# Patient Record
Sex: Female | Born: 1960 | Race: White | Hispanic: No | Marital: Single | Smoking: Never smoker
Health system: Southern US, Community
[De-identification: ages and names within clinical notes are randomized; demographics above are authoritative.]

## PROBLEM LIST (undated history)

## (undated) DIAGNOSIS — Z8619 Personal history of other infectious and parasitic diseases: Secondary | ICD-10-CM

## (undated) DIAGNOSIS — N959 Unspecified menopausal and perimenopausal disorder: Secondary | ICD-10-CM

## (undated) DIAGNOSIS — Z87440 Personal history of urinary (tract) infections: Secondary | ICD-10-CM

## (undated) DIAGNOSIS — N200 Calculus of kidney: Secondary | ICD-10-CM

## (undated) DIAGNOSIS — F411 Generalized anxiety disorder: Secondary | ICD-10-CM

## (undated) HISTORY — DX: Unspecified menopausal and perimenopausal disorder: N95.9

## (undated) HISTORY — PX: AUGMENTATION MAMMAPLASTY: SUR837

## (undated) HISTORY — DX: Personal history of urinary (tract) infections: Z87.440

## (undated) HISTORY — PX: COLONOSCOPY: SHX174

## (undated) HISTORY — DX: Personal history of other infectious and parasitic diseases: Z86.19

## (undated) HISTORY — DX: Generalized anxiety disorder: F41.1

## (undated) HISTORY — DX: Calculus of kidney: N20.0

---

## 1994-01-27 HISTORY — PX: BREAST ENHANCEMENT SURGERY: SHX7

## 1997-03-20 ENCOUNTER — Other Ambulatory Visit: Admission: RE | Admit: 1997-03-20 | Discharge: 1997-03-20 | Payer: Self-pay | Admitting: Obstetrics and Gynecology

## 1997-10-10 ENCOUNTER — Other Ambulatory Visit: Admission: RE | Admit: 1997-10-10 | Discharge: 1997-10-10 | Payer: Self-pay | Admitting: Obstetrics and Gynecology

## 1997-11-17 ENCOUNTER — Other Ambulatory Visit: Admission: RE | Admit: 1997-11-17 | Discharge: 1997-11-17 | Payer: Self-pay | Admitting: Obstetrics and Gynecology

## 1998-05-28 ENCOUNTER — Other Ambulatory Visit: Admission: RE | Admit: 1998-05-28 | Discharge: 1998-05-28 | Payer: Self-pay | Admitting: Obstetrics and Gynecology

## 1998-12-03 ENCOUNTER — Other Ambulatory Visit: Admission: RE | Admit: 1998-12-03 | Discharge: 1998-12-03 | Payer: Self-pay | Admitting: Obstetrics and Gynecology

## 1999-07-25 ENCOUNTER — Other Ambulatory Visit: Admission: RE | Admit: 1999-07-25 | Discharge: 1999-07-25 | Payer: Self-pay | Admitting: Obstetrics and Gynecology

## 2001-01-21 ENCOUNTER — Other Ambulatory Visit: Admission: RE | Admit: 2001-01-21 | Discharge: 2001-01-21 | Payer: Self-pay | Admitting: *Deleted

## 2012-02-06 ENCOUNTER — Encounter: Payer: Self-pay | Admitting: Family Medicine

## 2012-02-06 ENCOUNTER — Ambulatory Visit (INDEPENDENT_AMBULATORY_CARE_PROVIDER_SITE_OTHER): Payer: Self-pay | Admitting: Family Medicine

## 2012-02-06 VITALS — BP 128/86 | HR 94 | Temp 98.3°F | Ht 65.0 in | Wt 158.0 lb

## 2012-02-06 DIAGNOSIS — J019 Acute sinusitis, unspecified: Secondary | ICD-10-CM | POA: Insufficient documentation

## 2012-02-06 MED ORDER — CEFUROXIME AXETIL 500 MG PO TABS: 500.0000 mg | ORAL_TABLET | Freq: Two times a day (BID) | ORAL | Status: DC

## 2012-02-06 NOTE — Assessment & Plan Note (Signed)
Ceftin 500mg  bid x 10d. Mucinex DM OTC. Saline nasal spray 2-3 times per day. Tylenol or motrin prn pain.

## 2012-02-06 NOTE — Patient Instructions (Signed)
Mucinex DM OTC as directed on the box. Saline nasal spray 2-3 times per day. Lots of fluids and REST.

## 2012-02-06 NOTE — Progress Notes (Signed)
Office Note 02/06/2012  CC:  Chief Complaint  Patient presents with  . Establish Care    cough, congestion, runny nose, sneezing x 1 week, symptoms not going away    HPI:  Cynthia Booth is a 52 y.o. White female who is here to establish care and discuss cold sx's. Patient's most recent primary MD: none (some urgent cares in [place], years ago saw [facility] [employer]). Old records were not reviewed prior to or during today's visit.  Pt presents complaining of respiratory symptoms for 7 days.  Primary symptoms are: nasal congestion and drainage, lots of coughing and gets much worse.  Worst symptoms seems to be the cough.  Lately the symptoms seem to be worsening.  She describes double-sickening.  Unsure if she's had fever b/c she is having hot flashes due to menopause lately. Pertinent negatives: No fevers, no wheezing, and no SOB.  No pain in face or teeth.  No significant HA.  ST was initially bad but now it is gone. Symptoms made worse by night.  Symptoms improved by nyquil/ helps for short periods. Smoker? no Recent sick contact? no Muscle or joint aches? No, but feels fatigued on and off. Flu shot this season at least 2 wks ago? no  Additional ROS: no n/v/d or abdominal pain.  No rash.  No neck stiffness.   +Mild fatigue.  +Mild appetite loss. LMP about 8 mo ago--having hot flashes.  Of note, she has never had a mammogram (her choice) but she has gotten regular pap/pelvics (last was 2 yrs ago) and has no hx of abnormal paps.   Past Medical History  Diagnosis Date  . Nephrolithiasis   . History of infectious mononucleosis     In High School.  She had hepatitis from the mono (NOT HEP B OR HEP C virus).  . History of recurrent UTIs     when she has stones  . Perimenopausal disorder     Past Surgical History  Procedure Date  . Breast enhancement surgery 1996    Family History  Problem Relation Age of Onset  . Cancer Brother     NHL  . Hypertension  Maternal Grandmother     History   Social History  . Marital Status: Single    Spouse Name: N/A    Number of Children: N/A  . Years of Education: N/A   Occupational History  . Not on file.   Social History Main Topics  . Smoking status: Never Smoker   . Smokeless tobacco: Never Used  . Alcohol Use: No  . Drug Use: No  . Sexually Active: Not on file   Other Topics Concern  . 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    Outpatient Encounter Prescriptions as of 02/06/2012  Medication Sig Dispense Refill  . cefUROXime (CEFTIN) 500 MG tablet Take 1 tablet (500 mg total) by mouth 2 (two) times daily.  20 tablet  0    Allergies  Allergen Reactions  . Penicillins Hives    No tongue, lips, or throat swelling.  No SOB or wheezing.    ROS Review of Systems  Constitutional: Positive for fatigue. Negative for fever.  HENT:       See HPI  Eyes: Negative for visual disturbance.  Respiratory: Positive for cough.  See HPI  Cardiovascular: Negative for chest pain.  Gastrointestinal: Negative for nausea and abdominal pain.  Genitourinary: Negative for dysuria.  Musculoskeletal: Negative for back pain and joint swelling.  Skin: Negative for rash.  Neurological: Negative for weakness and headaches.  Hematological: Negative for adenopathy.    PE; Blood pressure 128/86, pulse 94, temperature 98.3 F (36.8 C), temperature source Temporal, height 5\' 5"  (1.651 m), weight 158 lb (71.668 kg), SpO2 94.00%. VS: noted--normal. Gen: alert, NAD, WELL- APPEARING WF in NAD. HEENT: eyes without injection, drainage, or swelling.  Ears: EACs clear, TMs with normal light reflex and landmarks.  Nose: Clear rhinorrhea, with some dried, crusty exudate adherent to mildly injected  mucosa.  No purulent d/c.  No paranasal sinus TTP.  No facial swelling.  Throat and mouth without focal lesion.  No pharyngial swelling, erythema, or exudate.   Neck: supple, no LAD.   LUNGS: CTA bilat, nonlabored resps.   CV: RRR, no m/r/g. EXT: no c/c/e SKIN: no rash   Pertinent labs:  none  ASSESSMENT AND PLAN:   New pt: no old records to obtain.  Sinusitis, acute Ceftin 500mg  bid x 10d. Mucinex DM OTC. Saline nasal spray 2-3 times per day. Tylenol or motrin prn pain.   An After Visit Summary was printed and given to the patient.  Return if symptoms worsen or fail to improve.

## 2012-02-16 ENCOUNTER — Telehealth: Payer: Self-pay | Admitting: Family Medicine

## 2012-02-16 MED ORDER — AZITHROMYCIN 250 MG PO TABS: ORAL_TABLET | ORAL | Status: DC

## 2012-02-16 NOTE — Telephone Encounter (Signed)
Pt notified.  She will continue Mucinex and nasal saline.

## 2012-02-16 NOTE — Telephone Encounter (Signed)
Dr McGowen, please advise. 

## 2012-02-16 NOTE — Telephone Encounter (Signed)
Pt was seen in the office on 02/06/12 for sinusitis.  Pt was treated with Ceftin.  Caller states the medication lightened her symptoms but it never went away.  She states she is still having the same symptoms of coughing all night and congestion as she did when seen.  Caller is requesting another antibiotic to be prescribed for her.  OFFICE PLEASE FOLLOW UP WITH PATIENT REGARDING RX.  Pt uses [facility] off [address].  Pt denies any difficulty breathing or wheezing.

## 2012-02-16 NOTE — Telephone Encounter (Signed)
Z-pack eRx'd just now. Make sure she is still doing the mucinex DM and saline nasal irrigation like I told her last visit. If not improved after this 5 day course of antibiotic then needs office f/u.-thx

## 2012-10-28 ENCOUNTER — Ambulatory Visit (INDEPENDENT_AMBULATORY_CARE_PROVIDER_SITE_OTHER): Payer: BC Managed Care – PPO | Admitting: Family Medicine

## 2012-10-28 ENCOUNTER — Encounter: Payer: Self-pay | Admitting: Family Medicine

## 2012-10-28 VITALS — BP 117/78 | HR 86 | Temp 97.8°F | Resp 16 | Ht 65.0 in | Wt 161.0 lb

## 2012-10-28 DIAGNOSIS — N390 Urinary tract infection, site not specified: Secondary | ICD-10-CM

## 2012-10-28 DIAGNOSIS — K219 Gastro-esophageal reflux disease without esophagitis: Secondary | ICD-10-CM

## 2012-10-28 LAB — POCT URINALYSIS DIPSTICK
Bilirubin, UA: NEGATIVE
Glucose, UA: NEGATIVE
Spec Grav, UA: >=1.03
pH, UA: 5.5

## 2012-10-28 MED ORDER — LANSOPRAZOLE 15 MG PO CPDR: 15.0000 mg | DELAYED_RELEASE_CAPSULE | Freq: Every day | ORAL | Status: DC

## 2012-10-28 MED ORDER — SULFAMETHOXAZOLE-TMP DS 800-160 MG PO TABS: 1.0000 | ORAL_TABLET | Freq: Two times a day (BID) | ORAL | Status: DC

## 2012-10-28 NOTE — Progress Notes (Signed)
OFFICE NOTE  10/28/2012  CC:  Chief Complaint  Patient presents with  . Dysuria    x 3 days  . Urinary Frequency  . Heartburn     HPI: Patient is a 52 y.o. Caucasian female who is here for dysuria.   Onset about 3 d/a, dysuria, urinary urgency, urinary frequency.  No fever or nausea or abd pain. Right mid back pain onset this morning--mild.  Also reports about 1 yr of heartburn daily.  Didn't respond to H2 blockers but responds fully to prevacid 15mg  OTC.  Pertinent PMH:  Past Medical History  Diagnosis Date  . Nephrolithiasis   . History of infectious mononucleosis     In High School.  She had hepatitis from the mono (NOT HEP B OR HEP C virus).  . History of recurrent UTIs     when she has stones  . Perimenopausal disorder    Past surgical, social, and family history reviewed and no changes noted since last office visit.  MEDS:  Prevacid 15mg  qd   PE: Blood pressure 117/78, pulse 86, temperature 97.8 F (36.6 C), temperature source Temporal, resp. rate 16, height 5\' 5"  (1.651 m), weight 161 lb (73.029 kg), SpO2 93.00%. Gen: Alert, well appearing.  Patient is oriented to person, place, time, and situation. CV: RRR, no m/r/g.   LUNGS: CTA bilat, nonlabored resps, good aeration in all lung fields. BACK: no CVA tenderness.  LAB: CC UA today--moderate blood, trace prot, moderate LEU, SG 1.030.  Otherwise UA normal.  IMPRESSION AND PLAN:  1) UTI, she appears well and has no signs of pyelo on exam. Bactrim DS 1 bid x 5d, send urine for c/s.  2) GERD: discussed dietary adjustments (GERD diet handout reviewed and given to patient), meds. Rx for generic prevacid 15mg  qd.  FOLLOW UP: prn

## 2012-10-30 LAB — URINE CULTURE: Colony Count: >=100000

## 2012-12-13 ENCOUNTER — Encounter: Payer: Self-pay | Admitting: Family Medicine

## 2012-12-13 ENCOUNTER — Ambulatory Visit (INDEPENDENT_AMBULATORY_CARE_PROVIDER_SITE_OTHER): Payer: BC Managed Care – PPO | Admitting: Family Medicine

## 2012-12-13 VITALS — BP 122/85 | HR 70 | Temp 97.6°F | Resp 16 | Ht 65.0 in | Wt 167.0 lb

## 2012-12-13 DIAGNOSIS — J387 Other diseases of larynx: Secondary | ICD-10-CM

## 2012-12-13 DIAGNOSIS — K219 Gastro-esophageal reflux disease without esophagitis: Secondary | ICD-10-CM

## 2012-12-13 DIAGNOSIS — L989 Disorder of the skin and subcutaneous tissue, unspecified: Secondary | ICD-10-CM

## 2012-12-13 MED ORDER — LANSOPRAZOLE 30 MG PO CPDR: 30.0000 mg | DELAYED_RELEASE_CAPSULE | Freq: Every day | ORAL | Status: DC

## 2012-12-13 NOTE — Progress Notes (Signed)
OFFICE NOTE   12/13/2012  CC:  Chief Complaint  Patient presents with  . glands swollen    white spots in throat x 3 days     HPI: Patient is a 52 y.o. Caucasian female who is here for feeling spots on back of throat starting 3 d/a, says they appear white.  Today woke up with sore on inside of mouth on right, feels some swollen neck glands with this sore.  No fevers, no malaise.  Appetite is OK, energy level is fine. Cough occ b/c the throat feels irritated.  Also has a spot on her face on the right cheek that has increased in size (doubled) over the last 4-5 mo.  The spot gets a little "crusty" occasionally.  No pain or itching.  No change in pigment.  Pertinent PMH:  Past Medical History  Diagnosis Date  . Nephrolithiasis   . History of infectious mononucleosis     In High School.  She had hepatitis from the mono (NOT HEP B OR HEP C virus).  . History of recurrent UTIs     when she has stones  . Perimenopausal disorder   GERD  Past surgical, social, and family history reviewed and no changes noted since last office visit.  MEDS:  Prevacid 15mg  qd  PE: Blood pressure 122/85, pulse 70, temperature 97.6 F (36.4 C), temperature source Temporal, resp. rate 16, height 5\' 5"  (1.651 m), weight 167 lb (75.751 kg), SpO2 98.00%. Gen: Alert, well appearing.  Patient is oriented to person, place, time, and situation. ENT: Ears: EACs clear, normal epithelium.  TMs with good light reflex and landmarks bilaterally.  Eyes: no injection, icteris, swelling, or exudate.  EOMI, PERRLA. Nose: no drainage or turbinate edema/swelling.  No injection or focal lesion.  Mouth: lips without lesion/swelling.  Oral mucosa pink and moist.  Dentition intact and without obvious caries or gingival swelling.  Oropharynx without erythema, exudate, or swelling.  She has a scant amount of tonsillar tissue, but there is a tiny amount of white material collected in a crypt on each tonsil.   I cannot see any  sores/ulcers anywhere.   IMPRESSION AND PLAN:  Laryngopharyngeal reflux Increase prevacid to 30mg  cap po qd. She'll pay closer attention to the dietary restrictions we discussed last visit. Elevated head of bed with a brick or 2 X 4.  Skin lesion of face Due to recent increase in size, we'll get her to derm for consideration of biopsy/removal.   An After Visit Summary was printed and given to the patient.  FOLLOW UP: prn

## 2012-12-13 NOTE — Assessment & Plan Note (Signed)
Increase prevacid to 30mg  cap po qd. She'll pay closer attention to the dietary restrictions we discussed last visit. Elevated head of bed with a brick or 2 X 4.

## 2012-12-13 NOTE — Assessment & Plan Note (Signed)
Due to recent increase in size, we'll get her to derm for consideration of biopsy/removal.

## 2012-12-22 ENCOUNTER — Encounter: Payer: Self-pay | Admitting: Family Medicine

## 2013-02-08 ENCOUNTER — Encounter: Payer: Self-pay | Admitting: Family Medicine

## 2013-02-08 ENCOUNTER — Ambulatory Visit (INDEPENDENT_AMBULATORY_CARE_PROVIDER_SITE_OTHER): Payer: Self-pay | Admitting: Family Medicine

## 2013-02-08 VITALS — BP 95/56 | HR 90 | Temp 98.2°F | Ht 65.0 in | Wt 167.0 lb

## 2013-02-08 DIAGNOSIS — J209 Acute bronchitis, unspecified: Secondary | ICD-10-CM

## 2013-02-08 MED ORDER — PREDNISONE 20 MG PO TABS: ORAL_TABLET | ORAL | Status: DC

## 2013-02-08 NOTE — Progress Notes (Signed)
Pre-visit discussion using our clinic review tool. No additional management support is needed unless otherwise documented below in the visit note.  

## 2013-02-08 NOTE — Progress Notes (Signed)
OFFICE NOTE  02/08/2013  CC:  Chief Complaint  Patient presents with  . Cough  . Sore Throat     HPI: Patient is a 53 y.o. Caucasian female who is here for cough/ST. Onset of bad ST 2 and 1/2 wks ago, severe fatigue, subjective fever, then started having lots and lots of coughing and nasal/sinus congestion.  She last felt like she had fever 3-4 days ago.  "Still stuffy in head and chest".  Upper chest hurts and upper back hurts when coughing, feels tight, slight SOB feeling.  No wheezing.  Tried mucinex DM and aspirin--helped a little. She did not get flu vaccine this season.  Pertinent PMH:  Past Medical History  Diagnosis Date  . Nephrolithiasis   . History of infectious mononucleosis     In High School.  She had hepatitis from the mono (NOT HEP B OR HEP C virus).  . History of recurrent UTIs     when she has stones  . Perimenopausal disorder     MEDS: as per HPI Outpatient Prescriptions Prior to Visit  Medication Sig Dispense Refill  . lansoprazole (PREVACID) 30 MG capsule Take 1 capsule (30 mg total) by mouth daily at 12 noon.  30 capsule  6   No facility-administered medications prior to visit.    PE: Blood pressure 95/56, pulse 90, temperature 98.2 F (36.8 C), temperature source Oral, height 5\' 5"  (1.651 m), weight 167 lb (75.751 kg), SpO2 94.00%. VS: noted--normal. Gen: alert, NAD, NONTOXIC APPEARING. HEENT: eyes without injection, drainage, or swelling.  Ears: EACs clear, TMs with normal light reflex and landmarks.  Nose: Clear rhinorrhea, with some dried, crusty exudate adherent to mildly injected mucosa.  No purulent d/c.  No paranasal sinus TTP.  No facial swelling.  Throat and mouth without focal lesion.  No pharyngial swelling, erythema, or exudate.   Neck: supple, no LAD.   LUNGS: CTA bilat, nonlabored resps.  Mild prolongation of exp phase but no wheezing. CV: RRR, no m/r/g. EXT: no c/c/e SKIN: no rash  IMPRESSION AND PLAN:  Acute bronchitis, suspect  viral etiology. Prednisone 40mg  qd x 5d. Symbicort sample 160/4.5 given today (pt has no insurance and cannot afford albut inhaler), 2 p bid x 2 wks.  FOLLOW UP: prn

## 2013-05-11 ENCOUNTER — Ambulatory Visit (INDEPENDENT_AMBULATORY_CARE_PROVIDER_SITE_OTHER): Payer: BC Managed Care – PPO | Admitting: Family Medicine

## 2013-05-11 ENCOUNTER — Encounter: Payer: Self-pay | Admitting: Family Medicine

## 2013-05-11 VITALS — BP 117/82 | HR 75 | Temp 98.2°F | Resp 18 | Ht 65.0 in | Wt 163.0 lb

## 2013-05-11 DIAGNOSIS — R300 Dysuria: Secondary | ICD-10-CM

## 2013-05-11 LAB — POCT URINALYSIS DIPSTICK
BILIRUBIN UA: NEGATIVE
Glucose, UA: 100
KETONES UA: NEGATIVE
Nitrite, UA: POSITIVE
PH UA: 5
PROTEIN UA: 100
Urobilinogen, UA: 2

## 2013-05-11 MED ORDER — SULFAMETHOXAZOLE-TMP DS 800-160 MG PO TABS: 1.0000 | ORAL_TABLET | Freq: Two times a day (BID) | ORAL | Status: DC

## 2013-05-11 NOTE — Progress Notes (Signed)
Pre visit review using our clinic review tool, if applicable. No additional management support is needed unless otherwise documented below in the visit note. 

## 2013-05-11 NOTE — Progress Notes (Signed)
OFFICE NOTE  05/11/2013  CC:  Chief Complaint  Patient presents with  . Dysuria    x 5 days  . Abdominal Pain     HPI: Patient is a 53 y.o. Caucasian female who is here for painful urination. Burning with urination onset 5d/a, some left suprapubic pain/LLQ pain.  No radiating flank pain or gross blood in urine. No nausea, no fever.  Took OTc AZO as recently as this morning. BM's: 1 loose stool per day since this started.  Pertinent PMH:  Past medical, surgical, social, and family history reviewed and no changes are noted since last office visit.  MEDS:  Prevacid 30mg  qd, AZO standard OTC lately  PE: Blood pressure 117/82, pulse 75, temperature 98.2 F (36.8 C), temperature source Temporal, resp. rate 18, height 5\' 5"  (1.651 m), weight 163 lb (73.936 kg), SpO2 98.00%. Gen: Alert, well appearing.  Patient is oriented to person, place, time, and situation. Oral: mucosa moist and pink CV: RRR, no m/r/g LUNGS: Clear CVA regions w/out TTP. ABD: soft, ND.  Some mild LLQ TTP w/out rebound or guarding.  No side or flank tenderness.  LAB: CC UA--orange/cloudy---globally positive/Abnl but not valid due to presence of AZO  IMPRESSION AND PLAN:  Lower UTI. Bactrim DS 1 bid x 3-5d. Urine sent for c/s.  An After Visit Summary was printed and given to the patient.   FOLLOW UP: prn

## 2013-05-14 LAB — URINE CULTURE: Colony Count: 70000

## 2013-05-18 ENCOUNTER — Encounter: Payer: Self-pay | Admitting: Family Medicine

## 2013-09-06 ENCOUNTER — Encounter: Payer: Self-pay | Admitting: Nurse Practitioner

## 2013-09-06 ENCOUNTER — Ambulatory Visit (INDEPENDENT_AMBULATORY_CARE_PROVIDER_SITE_OTHER): Payer: BC Managed Care – PPO | Admitting: Nurse Practitioner

## 2013-09-06 VITALS — BP 102/80 | HR 85 | Temp 98.0°F | Ht 65.0 in | Wt 162.0 lb

## 2013-09-06 DIAGNOSIS — K219 Gastro-esophageal reflux disease without esophagitis: Secondary | ICD-10-CM

## 2013-09-06 DIAGNOSIS — J0100 Acute maxillary sinusitis, unspecified: Secondary | ICD-10-CM

## 2013-09-06 MED ORDER — LANSOPRAZOLE 15 MG PO CPDR: 15.0000 mg | DELAYED_RELEASE_CAPSULE | Freq: Every day | ORAL | Status: DC

## 2013-09-06 MED ORDER — DOXYCYCLINE HYCLATE 100 MG PO TABS: 100.0000 mg | ORAL_TABLET | Freq: Two times a day (BID) | ORAL | Status: DC

## 2013-09-06 NOTE — Progress Notes (Signed)
Pre visit review using our clinic review tool, if applicable. No additional management support is needed unless otherwise documented below in the visit note. 

## 2013-09-06 NOTE — Progress Notes (Signed)
   Subjective:    Patient ID: Cynthia Booth, female    DOB: 10/21/1960, 53 y.o.   MRN: [id number]  HPI Comments: Would like to decrease omeprazole dose back to 15 mg-feels it controls symptoms. She has been taking it for about 3 mos. Higher dose made her have "full sensation" in throat. This resolved when she went back to lower dose.  Sinusitis This is a new problem. The current episode started in the past 7 days (6 days). The problem has been gradually worsening since onset. Maximum temperature: had chills. The pain is moderate. Associated symptoms include chills (resolved), congestion, coughing, headaches, sinus pressure, sneezing (resolved) and a sore throat (resolved). Pertinent negatives include no ear pain, hoarse voice or shortness of breath. Treatments tried: nyquil. The treatment provided no relief.      Review of Systems  Constitutional: Positive for chills (resolved).  HENT: Positive for congestion, sinus pressure, sneezing (resolved) and sore throat (resolved). Negative for ear pain and hoarse voice.   Respiratory: Positive for cough. Negative for shortness of breath.   Neurological: Positive for headaches.       Objective:   Physical Exam  Vitals reviewed. Constitutional: She is oriented to person, place, and time. She appears well-developed and well-nourished.  HENT:  Head: Normocephalic and atraumatic.  Right Ear: External ear normal.  Left Ear: External ear normal.  Mouth/Throat: Oropharynx is clear and moist. No oropharyngeal exudate.  Effusion L TM, bones visible, no retraction or bulge  Eyes: Conjunctivae are normal. Pupils are equal, round, and reactive to light. Right eye exhibits no discharge. Left eye exhibits no discharge.  Neck: Normal range of motion. Neck supple. No thyromegaly present.  Cardiovascular: Normal rate and normal heart sounds.   No murmur heard. Pulmonary/Chest: Effort normal and breath sounds normal. No respiratory distress. She has no  wheezes. She has no rales.  Lymphadenopathy:    She has no cervical adenopathy.  Neurological: She is alert and oriented to person, place, and time.  Skin: Skin is warm and dry.  Psychiatric: She has a normal mood and affect. Her behavior is normal. Thought content normal.          Assessment & Plan:  1. Acute maxillary sinusitis, recurrence not specified - doxycycline (VIBRA-TABS) 100 MG tablet; Take 1 tablet (100 mg total) by mouth 2 (two) times daily.  Dispense: 10 tablet; Refill: 0 F/u PRN  2. Gastroesophageal reflux disease without esophagitis - lansoprazole (PREVACID) 15 MG capsule; Take 1 capsule (15 mg total) by mouth daily at 12 noon.  Dispense: 30 capsule; Refill: 6

## 2013-09-06 NOTE — Patient Instructions (Signed)
Start daily sinus rinses (Neilmed Sinus rinse) for at least 5-7 days. Use pseudoephedrine 30 mg twice daily for 4-6 days. Take ibuprophen 200 to 400 mg twice daily for 4-6 days with food to decrease inflammation. If no improvement in 2 days, start doxycyline. Caution: no dairy within 2 hours, easy to sunburn. Eat yogurt daily at lunch or afternoon to help prevent diarrhea that can be caused by antibiotic.  Please call for re-evaluation if you are not improving.   Sinusitis Sinusitis is redness, soreness, and swelling (inflammation) of the paranasal sinuses. Paranasal sinuses are air pockets within the bones of your face (beneath the eyes, the middle of the forehead, or above the eyes). In healthy paranasal sinuses, mucus is able to drain out, and air is able to circulate through them by way of your nose. However, when your paranasal sinuses are inflamed, mucus and air can become trapped. This can allow bacteria and other germs to grow and cause infection. Sinusitis can develop quickly and last only a short time (acute) or continue over a long period (chronic). Sinusitis that lasts for more than 12 weeks is considered chronic.  CAUSES  Causes of sinusitis include:  Allergies.  Structural abnormalities, such as displacement of the cartilage that separates your nostrils (deviated septum), which can decrease the air flow through your nose and sinuses and affect sinus drainage.  Functional abnormalities, such as when the small hairs (cilia) that line your sinuses and help remove mucus do not work properly or are not present. SYMPTOMS  Symptoms of acute and chronic sinusitis are the same. The primary symptoms are pain and pressure around the affected sinuses. Other symptoms include:  Upper toothache.  Earache.  Headache.  Bad breath.  Decreased sense of smell and taste.  A cough, which worsens when you are lying flat.  Fatigue.  Fever.  Thick drainage from your nose, which often is green  and may contain pus (purulent).  Swelling and warmth over the affected sinuses. DIAGNOSIS  Your caregiver will perform a physical exam. During the exam, your caregiver may:  Look in your nose for signs of abnormal growths in your nostrils (nasal polyps).  Tap over the affected sinus to check for signs of infection.  View the inside of your sinuses (endoscopy) with a special imaging device with a light attached (endoscope), which is inserted into your sinuses. If your caregiver suspects that you have chronic sinusitis, one or more of the following tests may be recommended:  Allergy tests.  Nasal culture A sample of mucus is taken from your nose and sent to a lab and screened for bacteria.  Nasal cytology A sample of mucus is taken from your nose and examined by your caregiver to determine if your sinusitis is related to an allergy. TREATMENT  Most cases of acute sinusitis are related to a viral infection and will resolve on their own within 10 days. Sometimes medicines are prescribed to help relieve symptoms (pain medicine, decongestants, nasal steroid sprays, or saline sprays).  However, for sinusitis related to a bacterial infection, your caregiver will prescribe antibiotic medicines. These are medicines that will help kill the bacteria causing the infection.  Rarely, sinusitis is caused by a fungal infection. In theses cases, your caregiver will prescribe antifungal medicine. For some cases of chronic sinusitis, surgery is needed. Generally, these are cases in which sinusitis recurs more than 3 times per year, despite other treatments. HOME CARE INSTRUCTIONS   Drink plenty of water. Water helps thin the mucus  so your sinuses can drain more easily.  Use a humidifier.  Inhale steam 3 to 4 times a day (for example, sit in the bathroom with the shower running).  Apply a warm, moist washcloth to your face 3 to 4 times a day, or as directed by your caregiver.  Use saline nasal sprays to  help moisten and clean your sinuses.  Take over-the-counter or prescription medicines for pain, discomfort, or fever only as directed by your caregiver. SEEK IMMEDIATE MEDICAL CARE IF:  You have increasing pain or severe headaches.  You have nausea, vomiting, or drowsiness.  You have swelling around your face.  You have vision problems.  You have a stiff neck.  You have difficulty breathing. MAKE SURE YOU:   Understand these instructions.  Will watch your condition.  Will get help right away if you are not doing well or get worse. Document Released: 01/13/2005 Document Revised: 04/07/2011 Document Reviewed: 01/28/2011 [facility] Patient Information 2014 [place], [place].

## 2013-09-09 ENCOUNTER — Telehealth: Payer: Self-pay | Admitting: Nurse Practitioner

## 2013-09-09 ENCOUNTER — Encounter: Payer: Self-pay | Admitting: Family Medicine

## 2013-09-09 ENCOUNTER — Ambulatory Visit (INDEPENDENT_AMBULATORY_CARE_PROVIDER_SITE_OTHER): Payer: BC Managed Care – PPO | Admitting: Family Medicine

## 2013-09-09 VITALS — BP 113/74 | HR 76 | Temp 98.3°F | Resp 18 | Ht 65.0 in | Wt 162.0 lb

## 2013-09-09 DIAGNOSIS — J0100 Acute maxillary sinusitis, unspecified: Secondary | ICD-10-CM

## 2013-09-09 MED ORDER — CEFUROXIME AXETIL 500 MG PO TABS: 500.0000 mg | ORAL_TABLET | Freq: Two times a day (BID) | ORAL | Status: DC

## 2013-09-09 NOTE — Telephone Encounter (Signed)
Patient called back and scheduled appt with Dr. Milinda CaveMcGowen for today at 11:15am

## 2013-09-09 NOTE — Telephone Encounter (Signed)
Noted  

## 2013-09-09 NOTE — Telephone Encounter (Signed)
Patient states when she took the pills that were prescribed they made her nauseous. The nasal wash is not working. Please contact patient.

## 2013-09-09 NOTE — Progress Notes (Signed)
Pre visit review using our clinic review tool, if applicable. No additional management support is needed unless otherwise documented below in the visit note. 

## 2013-09-09 NOTE — Progress Notes (Signed)
OFFICE NOTE  09/09/2013  CC:  Chief Complaint  Patient presents with  . Sinusitis    f/u saw [facility] 09/06/13  . Facial Pain   HPI: Patient is a 53 y.o. Caucasian female who is here for ongoing right sided maxillary sinus pain with congestion.  Using saline irrigation and left side clears out fine.  Subjective fever.  No HA.  +Right upper teeth pain diffusely, but doesn't hurt to touch/brush teeth. Doxy rx'd 3 d/a not tolerated (vomiting) after 1 dose.  No signif cough.  No ST.  No HA. Says rx'n to penicillin in the past was a rash, without swelling in lips, tongue, eyes, throat, or elsewhere.  No wheezing or SOB with penicillins.    Pertinent PMH:  Past Medical History  Diagnosis Date  . Nephrolithiasis   . History of infectious mononucleosis     In High School.  She had hepatitis from the mono (NOT HEP B OR HEP C virus).  . History of recurrent UTIs     when she has stones  . Perimenopausal disorder     MEDS:  Outpatient Prescriptions Prior to Visit  Medication Sig Dispense Refill  . lansoprazole (PREVACID) 15 MG capsule Take 1 capsule (15 mg total) by mouth daily at 12 noon.  30 capsule  6  . doxycycline (VIBRA-TABS) 100 MG tablet Take 1 tablet (100 mg total) by mouth 2 (two) times daily.  10 tablet  0   No facility-administered medications prior to visit.    PE: Blood pressure 113/74, pulse 76, temperature 98.3 F (36.8 C), temperature source Temporal, resp. rate 18, height 5\' 5"  (1.651 m), weight 162 lb (73.483 kg), SpO2 95.00%. VS: noted--normal. Gen: alert, NAD, NONTOXIC APPEARING. HEENT: eyes without injection, drainage, or swelling.  Ears: EACs clear, TMs with normal light reflex and landmarks.  Nose: Clear rhinorrhea, with some dried, crusty exudate adherent to mildly injected mucosa.  No purulent d/c.  Right maxillary sinus tenderness to palpation. No facial swelling.  Throat and mouth without focal lesion.  No pharyngial swelling, erythema, or exudate.   Neck:  supple, no LAD.  Teeth without tenderness.  No gingivitis. LUNGS: CTA bilat, nonlabored resps.   CV: RRR, no m/r/g. EXT: no c/c/e SKIN: no rash  LAB: none today  IMPRESSION AND PLAN:  Acute maxillary sinusitis.  Intolerant of doxycycline. Continue sinus rinses, d/c doxy, and start ceftin 500mg  bid x 10d.  An After Visit Summary was printed and given to the patient.  FOLLOW UP: prn

## 2013-11-04 ENCOUNTER — Ambulatory Visit (INDEPENDENT_AMBULATORY_CARE_PROVIDER_SITE_OTHER): Payer: Self-pay | Admitting: Family Medicine

## 2013-11-04 ENCOUNTER — Encounter: Payer: Self-pay | Admitting: Family Medicine

## 2013-11-04 VITALS — BP 118/86 | HR 110 | Temp 99.0°F | Resp 18 | Ht 65.0 in | Wt 165.0 lb

## 2013-11-04 DIAGNOSIS — J029 Acute pharyngitis, unspecified: Secondary | ICD-10-CM

## 2013-11-04 DIAGNOSIS — J208 Acute bronchitis due to other specified organisms: Secondary | ICD-10-CM

## 2013-11-04 LAB — POCT RAPID STREP A (OFFICE): RAPID STREP A SCREEN: NEGATIVE

## 2013-11-04 MED ORDER — HYDROCODONE-HOMATROPINE 5-1.5 MG/5ML PO SYRP: ORAL_SOLUTION | ORAL | Status: DC

## 2013-11-04 MED ORDER — PREDNISONE 20 MG PO TABS: ORAL_TABLET | ORAL | Status: DC

## 2013-11-04 NOTE — Progress Notes (Signed)
Pre visit review using our clinic review tool, if applicable. No additional management support is needed unless otherwise documented below in the visit note. 

## 2013-11-04 NOTE — Progress Notes (Signed)
OFFICE NOTE  11/04/2013  CC:  Chief Complaint  Patient presents with  . Sore Throat  . chest congestion  . Cough   HPI: Patient is a 53 y.o. Caucasian female who is here for respiratory complaints.   Onset 2d/a of ST, mucous in throat, cough productive of greenish/yellow mucous that is thick.   Little wheezing, no chest tightness.  Subjective fever, no temp checked.   Mucinex DM and nyquil did nothing.    Pertinent PMH:  Past medical, surgical, social, and family history reviewed and no changes are noted since last office visit.  MEDS:  Outpatient Prescriptions Prior to Visit  Medication Sig Dispense Refill  . cefUROXime (CEFTIN) 500 MG tablet Take 1 tablet (500 mg total) by mouth 2 (two) times daily with a meal.  20 tablet  0  . lansoprazole (PREVACID) 15 MG capsule Take 1 capsule (15 mg total) by mouth daily at 12 noon.  30 capsule  6   No facility-administered medications prior to visit.    PE: Blood pressure 118/86, pulse 110, temperature 99 F (37.2 C), temperature source Temporal, resp. rate 18, height 5\' 5"  (1.651 m), weight 165 lb (74.844 kg), SpO2 96.00%. VS: noted--normal. Gen: alert, NAD, NONTOXIC APPEARING. HEENT: eyes without injection, drainage, or swelling.  Ears: EACs clear, TMs with normal light reflex and landmarks.  Nose: Clear rhinorrhea, with some dried, crusty exudate adherent to mildly injected mucosa.  No purulent d/c.  No paranasal sinus TTP.  No facial swelling.  Throat and mouth without focal lesion.  No pharyngial swelling, erythema, or exudate.   Neck: supple, no LAD.   LUNGS: CTA bilat, nonlabored resps.  Some post-exhalation coughing. CV: RRR, no m/r/g. EXT: no c/c/e SKIN: no rash   LAB:  Rapid strep negative  IMPRESSION AND PLAN: Acute viral bronchitis. Mild RAD component. Prednisone 40mg  qd x 5d. Hycodan susp 1-2 tsp qhs prn, #14320ml.  Mucinex dm daytime.  An After Visit Summary was printed and given to the patient.  FOLLOW UP:  prn

## 2013-11-06 LAB — CULTURE, GROUP A STREP: ORGANISM ID, BACTERIA: NORMAL

## 2013-11-11 ENCOUNTER — Ambulatory Visit (INDEPENDENT_AMBULATORY_CARE_PROVIDER_SITE_OTHER): Payer: Self-pay | Admitting: Family Medicine

## 2013-11-11 ENCOUNTER — Encounter: Payer: Self-pay | Admitting: Family Medicine

## 2013-11-11 VITALS — BP 113/75 | HR 91 | Temp 97.8°F | Resp 18 | Ht 65.0 in | Wt 164.0 lb

## 2013-11-11 DIAGNOSIS — J180 Bronchopneumonia, unspecified organism: Secondary | ICD-10-CM

## 2013-11-11 MED ORDER — PREDNISONE 20 MG PO TABS: ORAL_TABLET | ORAL | Status: DC

## 2013-11-11 MED ORDER — ALBUTEROL SULFATE HFA 108 (90 BASE) MCG/ACT IN AERS: INHALATION_SPRAY | RESPIRATORY_TRACT | Status: DC

## 2013-11-11 MED ORDER — AZITHROMYCIN 250 MG PO TABS: 250.0000 mg | ORAL_TABLET | Freq: Every day | ORAL | Status: DC

## 2013-11-11 NOTE — Assessment & Plan Note (Addendum)
Prednisone 40mg  qd x 5d, then 20mg  qd x 5d. Azithromycin x 5d.. Ventolin HFA 1-2 p q4h prn.

## 2013-11-11 NOTE — Progress Notes (Signed)
OFFICE VISIT  11/11/2013   CC:  Chief Complaint  Patient presents with  . Fever  . Cough  . Chills   HPI:    Patient is a 53 y.o. Caucasian female who presents for respiratory complaints.  She got back to her baseline for a couple of days after taking 5d of prednisone for acute viral bronchitis.  Hycodan cough med helped significantly.  Subjective f/c last night, took aspirin for this.  Still feeling mucous in back of throat.  Chest starting to hurt/side hurting from coughing.  Face/sinus region w/out pressure.  "It's all in my chest"  Past Medical History  Diagnosis Date  . Nephrolithiasis   . History of infectious mononucleosis     In High School.  She had hepatitis from the mono (NOT HEP B OR HEP C virus).  . History of recurrent UTIs     when she has stones  . Perimenopausal disorder     Past Surgical History  Procedure Laterality Date  . Breast enhancement surgery  1996   MEDS: not on prednisone listed below Outpatient Prescriptions Prior to Visit  Medication Sig Dispense Refill  . HYDROcodone-homatropine (HYCODAN) 5-1.5 MG/5ML syrup 1-2 tsp po qhs prn cough  120 mL  0  . lansoprazole (PREVACID) 15 MG capsule Take 1 capsule (15 mg total) by mouth daily at 12 noon.  30 capsule  6  . predniSONE (DELTASONE) 20 MG tablet 2 tabs po qd x 5d  10 tablet  0   No facility-administered medications prior to visit.    Allergies  Allergen Reactions  . Doxycycline Nausea And Vomiting  . Penicillins Hives    No tongue, lips, or throat swelling.  No SOB or wheezing.    ROS As per HPI  PE: Blood pressure 113/75, pulse 91, temperature 97.8 F (36.6 C), temperature source Temporal, resp. rate 18, height 5\' 5"  (1.651 m), weight 164 lb (74.39 kg), SpO2 97.00%. VS: noted--normal. Gen: alert, NAD, NONTOXIC APPEARING. HEENT: eyes without injection, drainage, or swelling.  Ears: EACs clear, TMs with normal light reflex and landmarks.  Nose: Scant clear rhinorrhea, with some dried,  crusty exudate adherent to mildly injected mucosa.  No purulent d/c.  No paranasal sinus TTP.  No facial swelling.  Throat and mouth without focal lesion.  No pharyngial swelling, erythema, or exudate.   Neck: supple, no LAD.   LUNGS: CTA bilat, nonlabored resps.  Lots of dry, post-exhalation coughing.   CV: RRR, no m/r/g. EXT: no c/c/e SKIN: no rash  LABS:  None today  IMPRESSION AND PLAN:  Bronchopneumonia Prednisone 40mg  qd x 5d, then 20mg  qd x 5d. Azithromycin x 5d.. Ventolin HFA 1-2 p q4h prn.   An After Visit Summary was printed and given to the patient.  FOLLOW UP: Return if symptoms worsen or fail to improve.

## 2013-11-11 NOTE — Progress Notes (Signed)
Pre visit review using our clinic review tool, if applicable. No additional management support is needed unless otherwise documented below in the visit note. 

## 2013-11-28 ENCOUNTER — Other Ambulatory Visit: Payer: Self-pay | Admitting: *Deleted

## 2013-11-28 DIAGNOSIS — K219 Gastro-esophageal reflux disease without esophagitis: Secondary | ICD-10-CM

## 2013-11-28 MED ORDER — LANSOPRAZOLE 15 MG PO CPDR: 15.0000 mg | DELAYED_RELEASE_CAPSULE | Freq: Every day | ORAL | Status: DC

## 2013-11-28 NOTE — Telephone Encounter (Signed)
[facility] requested 90 supply.

## 2014-04-20 ENCOUNTER — Ambulatory Visit (INDEPENDENT_AMBULATORY_CARE_PROVIDER_SITE_OTHER): Payer: Self-pay | Admitting: Family Medicine

## 2014-04-20 ENCOUNTER — Encounter: Payer: Self-pay | Admitting: Family Medicine

## 2014-04-20 VITALS — BP 110/80 | HR 68 | Temp 98.1°F | Ht 65.0 in | Wt 167.0 lb

## 2014-04-20 DIAGNOSIS — R350 Frequency of micturition: Secondary | ICD-10-CM

## 2014-04-20 LAB — POCT URINALYSIS DIPSTICK
BILIRUBIN UA: NEGATIVE
Glucose, UA: NEGATIVE
KETONES UA: NEGATIVE
Nitrite, UA: NEGATIVE
PH UA: 5.5
Protein, UA: NEGATIVE
SPEC GRAV UA: 1.025
Urobilinogen, UA: 0.2

## 2014-04-20 MED ORDER — SULFAMETHOXAZOLE-TRIMETHOPRIM 800-160 MG PO TABS: 1.0000 | ORAL_TABLET | Freq: Two times a day (BID) | ORAL | Status: DC

## 2014-04-20 MED ORDER — FLUCONAZOLE 150 MG PO TABS: 150.0000 mg | ORAL_TABLET | Freq: Once | ORAL | Status: DC

## 2014-04-20 NOTE — Progress Notes (Signed)
OFFICE NOTE  04/20/2014  CC: "urinary urgency" HPI: Patient is a 54 y.o. Caucasian female who is here for onset of vag itching and burning 4-5 days ago, took monistat x 1 dose and felt improvement so didn't take any more.  Then sx's returned AND now has burning with urination and urinary frequency and urgency.  +Suprapubic pain constant last couple days.  No fever or nausea. No vag discharge noted.   No antibiotics in the last month.  No flank or back pain.  Pertinent PMH:  Past medical, surgical, social, and family history reviewed and no changes are noted since last office visit. +Hx of recurrent UTI's  MEDS:  Prevacid 15mg  qd, albuterol HFA q4h prn  PE: Blood pressure 110/80, pulse 68, temperature 98.1 F (36.7 C), temperature source Oral, height 5\' 5"  (1.651 m), weight 167 lb (75.751 kg), SpO2 98 %. Gen: Alert, well appearing.  Patient is oriented to person, place, time, and situation. CV: RRR, no m/r/g.   LUNGS: CTA bilat, nonlabored resps, good aeration in all lung fields. ABd: mild suprapubic TTP with deep palpation, otherwise abdomen nontender.  No distention.  LAB: CC UA today showed small blood and large LEU, otherwise normal  IMPRESSION AND PLAN:  1) Yeast vaginitis suspected, under-treated.  Fluconazole 150mg  po x 1 dose.  2) Simple UTI suspected as well: bactrim DS 1 tab bid x 3d (5 days-worth of med given, instructions to take the additional 2 days if still symptomatic after 3d). Send urine for c/s.  An After Visit Summary was printed and given to the patient.   FOLLOW UP: prn

## 2014-04-20 NOTE — Progress Notes (Signed)
Pre visit review using our clinic review tool, if applicable. No additional management support is needed unless otherwise documented below in the visit note. 

## 2014-04-22 ENCOUNTER — Encounter: Payer: Self-pay | Admitting: Family Medicine

## 2014-04-22 LAB — URINE CULTURE

## 2014-06-12 ENCOUNTER — Telehealth: Payer: Self-pay

## 2014-06-12 NOTE — Telephone Encounter (Signed)
LMOVM asking patient to call back. 

## 2014-06-15 NOTE — Telephone Encounter (Signed)
No call back. Will await for call back. Closing Encounter 

## 2014-06-21 ENCOUNTER — Encounter: Payer: Self-pay | Admitting: Family Medicine

## 2014-09-25 ENCOUNTER — Ambulatory Visit (INDEPENDENT_AMBULATORY_CARE_PROVIDER_SITE_OTHER): Payer: BLUE CROSS/BLUE SHIELD | Admitting: Family Medicine

## 2014-09-25 ENCOUNTER — Encounter: Payer: Self-pay | Admitting: Family Medicine

## 2014-09-25 VITALS — BP 106/74 | HR 85 | Temp 98.0°F | Resp 16 | Ht 65.0 in | Wt 162.0 lb

## 2014-09-25 DIAGNOSIS — K645 Perianal venous thrombosis: Secondary | ICD-10-CM | POA: Diagnosis not present

## 2014-09-25 MED ORDER — HYDROCORTISONE 2.5 % RE CREA: 1.0000 "application " | TOPICAL_CREAM | Freq: Two times a day (BID) | RECTAL | Status: DC

## 2014-09-25 NOTE — Progress Notes (Signed)
Pre visit review using our clinic review tool, if applicable. No additional management support is needed unless otherwise documented below in the visit note. 

## 2014-09-25 NOTE — Patient Instructions (Signed)
Buy OTC "Sitz bath" and do this once a day for 20 min for the next 7d.

## 2014-09-25 NOTE — Progress Notes (Signed)
OFFICE NOTE  09/25/2014  CC:  Chief Complaint  Patient presents with  . Hemorrhoids   HPI: Patient is a 54 y.o. Caucasian female who is here for about 1 mo of painful anal area, with some bleeding on and off during this time.  Pain is 5/10 intensity, itchy too.  She feels a lump near anal opening with fingers.  BRB on toilet tissue and some drops in stool when having BM. No constipation.  No otc meds tried for this.  Pertinent PMH:  Past medical, surgical, social, and family history reviewed and no changes are noted since last office visit.  MEDS:  Outpatient Prescriptions Prior to Visit  Medication Sig Dispense Refill  . lansoprazole (PREVACID) 15 MG capsule Take 1 capsule (15 mg total) by mouth daily at 12 noon. 90 capsule 1  . albuterol (VENTOLIN HFA) 108 (90 BASE) MCG/ACT inhaler 1-2 puffs q4h prn excessive dry cough, chest tightness, wheezing (Patient not taking: Reported on 09/25/2014) 1 Inhaler 0  . fluconazole (DIFLUCAN) 150 MG tablet Take 1 tablet (150 mg total) by mouth once. (Patient not taking: Reported on 09/25/2014) 1 tablet 0  . sulfamethoxazole-trimethoprim (BACTRIM DS,SEPTRA DS) 800-160 MG per tablet Take 1 tablet by mouth 2 (two) times daily. (Patient not taking: Reported on 09/25/2014) 10 tablet 0   No facility-administered medications prior to visit.    PE: Blood pressure 106/74, pulse 85, temperature 98 F (36.7 C), temperature source Oral, resp. rate 16, height  (1.651 m), weight 162 lb (73.483 kg), SpO2 94 %.  Pt examined with Wallace Keller, CMA, as chaperone.  Gen: Alert, well appearing.  Patient is oriented to person, place, time, and situation. Mildly pinkish hued, 2 cm, firm but not tense, external hemorrhoid at 3 o'clock position. Very small superficial anal fissure just superior to this hemorrhoid, no blood noted.   IMPRESSION AND PLAN:  Thrombosed external hemorrhoid, only mild/moderate pain.  No need for I&D of the thrombosis at this  time. Recommended topical hydrocortisone 2.5% cream and sitz bath qd x 7d. Signs/symptoms to call or return for were reviewed and pt expressed understanding.  An After Visit Summary was printed and given to the patient.  FOLLOW UP: prn

## 2014-11-20 ENCOUNTER — Other Ambulatory Visit: Payer: Self-pay | Admitting: Family Medicine

## 2014-11-20 DIAGNOSIS — K219 Gastro-esophageal reflux disease without esophagitis: Secondary | ICD-10-CM

## 2014-11-20 MED ORDER — LANSOPRAZOLE 15 MG PO CPDR: 15.0000 mg | DELAYED_RELEASE_CAPSULE | Freq: Every day | ORAL | Status: DC

## 2014-12-29 ENCOUNTER — Ambulatory Visit (INDEPENDENT_AMBULATORY_CARE_PROVIDER_SITE_OTHER): Payer: BLUE CROSS/BLUE SHIELD | Admitting: Family Medicine

## 2014-12-29 ENCOUNTER — Encounter: Payer: Self-pay | Admitting: Family Medicine

## 2014-12-29 DIAGNOSIS — J180 Bronchopneumonia, unspecified organism: Secondary | ICD-10-CM | POA: Diagnosis not present

## 2014-12-29 MED ORDER — ALBUTEROL SULFATE HFA 108 (90 BASE) MCG/ACT IN AERS: 2.0000 | INHALATION_SPRAY | Freq: Four times a day (QID) | RESPIRATORY_TRACT | Status: DC | PRN

## 2014-12-29 MED ORDER — AZITHROMYCIN 250 MG PO TABS: ORAL_TABLET | ORAL | Status: DC

## 2014-12-29 MED ORDER — BENZONATATE 200 MG PO CAPS: 200.0000 mg | ORAL_CAPSULE | Freq: Two times a day (BID) | ORAL | Status: DC | PRN

## 2014-12-29 MED ORDER — PREDNISONE 20 MG PO TABS: ORAL_TABLET | ORAL | Status: DC

## 2014-12-29 NOTE — Progress Notes (Signed)
   Subjective:    Patient ID: Cynthia SaranMaureen A Iwan, female    DOB: 06/19/1960, 54 y.o.   MRN: [id number]  HPI  Cough: Patient presents with a 4-5  day history of nonproductive cough, fatigue, shortness of breath, rhinorrhea, congestion and occasional chills. She denies fever, nausea, vomit, diarrhea, rash or decreased appetite. Patient denies any sick contacts, sore throat and myalgia She has been taking NyQuil and Sudafed to help with the symptoms, but feels like her symptoms are progressing, and she is more fatigued. Patient did not receive her flu shot this year.   Never smoker  Past Medical History  Diagnosis Date  . Nephrolithiasis   . History of infectious mononucleosis     In High School.  She had hepatitis from the mono (NOT HEP B OR HEP C virus).  . History of recurrent UTIs     when she has stones esp  . Perimenopausal disorder    Allergies  Allergen Reactions  . Doxycycline Nausea And Vomiting  . Penicillins Hives    No tongue, lips, or throat swelling.  No SOB or wheezing.    Review of Systems Negative, with the exception of above mentioned in HPI     Objective:   Physical Exam BP 119/80 mmHg  Pulse 81  Temp(Src) 97.9 F (36.6 C) (Oral)  Resp 14  Ht 5\' 5"  (1.651 m)  Wt 167 lb 1.9 oz (75.805 kg)  BMI 27.81 kg/m2 Gen: Afebrile. No acute distress. Nontoxic in appearance, well-developed, well-nourished, Caucasian female. HENT: AT. Stamps. Bilateral TM visualized and normal in appearance. MMM, no oral lesions. Bilateral nares without erythema or swelling. Throat with mild erythemas, no exudates. Persistent cough present on exam. Mild hoarseness present on exam. Eyes:Pupils Equal Round Reactive to light, Extraocular movements intact,  Conjunctiva without redness, discharge or icterus. Neck/lymp/endocrine: Supple, moderate anterior cervical lymphadenopathy CV: RRR  Chest: CTAB, no wheeze or crackles. Rhonchi anteriorly. Production of cough with deep breath. Good air  movement Abd: Soft. NTND. BS present.  Skin: No rashes, purpura or petechiae.      Assessment & Plan:  1. Bronchopneumonia - Patient with signs and symptoms of bronchopneumonia. - azithromycin (ZITHROMAX Z-PAK) 250 MG tablet; 500 mg day 1, then 250 mg QD  Dispense: 6 each; Refill: 0 - predniSONE (DELTASONE) 20 MG tablet; 40mg  qd x 5d, then 20mg  qd x 5d.  Dispense: 15 tablet; Refill: 0 - albuterol (PROVENTIL HFA;VENTOLIN HFA) 108 (90 BASE) MCG/ACT inhaler; Inhale 2 puffs into the lungs every 6 (six) hours as needed for wheezing or shortness of breath.  Dispense: 1 Inhaler; Refill: 0 - benzonatate (TESSALON) 200 MG capsule; Take 1 capsule (200 mg total) by mouth 2 (two) times daily as needed for cough.  Dispense: 20 capsule; Refill: 0

## 2014-12-29 NOTE — Patient Instructions (Addendum)
I have called in a cough Supressant, antibiotic, inhaler and steroid.  You should improvement with 3 days. May not see resolution for 10 days.

## 2014-12-29 NOTE — Progress Notes (Signed)
Pre visit review using our clinic review tool, if applicable. No additional management support is needed unless otherwise documented below in the visit note. 

## 2015-03-13 ENCOUNTER — Ambulatory Visit (INDEPENDENT_AMBULATORY_CARE_PROVIDER_SITE_OTHER): Payer: BLUE CROSS/BLUE SHIELD | Admitting: Family Medicine

## 2015-03-13 ENCOUNTER — Encounter: Payer: Self-pay | Admitting: Family Medicine

## 2015-03-13 VITALS — BP 131/79 | HR 87 | Temp 97.8°F | Resp 16 | Ht 65.0 in | Wt 167.2 lb

## 2015-03-13 DIAGNOSIS — M130 Polyarthritis, unspecified: Secondary | ICD-10-CM | POA: Diagnosis not present

## 2015-03-13 DIAGNOSIS — M129 Arthropathy, unspecified: Secondary | ICD-10-CM

## 2015-03-13 DIAGNOSIS — M1710 Unilateral primary osteoarthritis, unspecified knee: Secondary | ICD-10-CM

## 2015-03-13 LAB — COMPREHENSIVE METABOLIC PANEL
ALBUMIN: 4.7 g/dL (ref 3.5–5.2)
ALK PHOS: 79 U/L (ref 39–117)
ALT: 17 U/L (ref 0–35)
AST: 17 U/L (ref 0–37)
BUN: 12 mg/dL (ref 6–23)
CALCIUM: 9.7 mg/dL (ref 8.4–10.5)
CHLORIDE: 105 meq/L (ref 96–112)
CO2: 29 mEq/L (ref 19–32)
CREATININE: 0.74 mg/dL (ref 0.40–1.20)
GFR: 86.76 mL/min (ref >60.00)
Glucose, Bld: 97 mg/dL (ref 70–99)
POTASSIUM: 3.7 meq/L (ref 3.5–5.1)
SODIUM: 141 meq/L (ref 135–145)
TOTAL PROTEIN: 7.5 g/dL (ref 6.0–8.3)
Total Bilirubin: 0.3 mg/dL (ref 0.2–1.2)

## 2015-03-13 LAB — CBC WITH DIFFERENTIAL/PLATELET
BASOS PCT: 0.4 % (ref 0.0–3.0)
Basophils Absolute: 0 10*3/uL (ref 0.0–0.1)
EOS PCT: 1.1 % (ref 0.0–5.0)
Eosinophils Absolute: 0.1 10*3/uL (ref 0.0–0.7)
HEMATOCRIT: 42.6 % (ref 36.0–46.0)
HEMOGLOBIN: 14.6 g/dL (ref 12.0–15.0)
LYMPHS PCT: 27.6 % (ref 12.0–46.0)
Lymphs Abs: 2.5 10*3/uL (ref 0.7–4.0)
MCHC: 34.2 g/dL (ref 30.0–36.0)
MCV: 89.5 fl (ref 78.0–100.0)
MONO ABS: 0.6 10*3/uL (ref 0.1–1.0)
Monocytes Relative: 6.2 % (ref 3.0–12.0)
Neutro Abs: 5.9 10*3/uL (ref 1.4–7.7)
Neutrophils Relative %: 64.7 % (ref 43.0–77.0)
Platelets: 286 10*3/uL (ref 150.0–400.0)
RBC: 4.76 Mil/uL (ref 3.87–5.11)
RDW: 13.6 % (ref 11.5–15.5)
WBC: 9.1 10*3/uL (ref 4.0–10.5)

## 2015-03-13 LAB — SEDIMENTATION RATE: SED RATE: 17 mm/h (ref 0–22)

## 2015-03-13 LAB — URIC ACID: Uric Acid, Serum: 4.9 mg/dL (ref 2.4–7.0)

## 2015-03-13 LAB — C-REACTIVE PROTEIN: CRP: 0.5 mg/dL (ref 0.5–20.0)

## 2015-03-13 LAB — RHEUMATOID FACTOR: Rhuematoid fact SerPl-aCnc: <10 IU/mL (ref <=14)

## 2015-03-13 NOTE — Progress Notes (Signed)
Pre visit review using our clinic review tool, if applicable. No additional management support is needed unless otherwise documented below in the visit note. 

## 2015-03-13 NOTE — Progress Notes (Signed)
OFFICE VISIT  03/13/2015   CC:  Chief Complaint  Patient presents with  . Joint Pain    x 6-8 month   HPI:    Patient is a 55 y.o. Caucasian female who presents for roving joint pains. Seemed to start in both feet w/out swelling or redness and lasted about a week and it spontaneously resolved.  Then about 3-4 weeks later she felt pain in neck, ROm intact.  Lasted about a week and spontaneously resolved. Then a month or so later, right hip hurt, with stiffness---she couldn't complete some hip rolls in yoga class.  This spontaneously resolved after about a week.  Left hip never bothered her.  Now both knees hurt,  R knee with stiffness and swelling--going on for about a week now.  No redness or warmth.  Left knee with some pain but no swelling. No feelings of fatigue, malaise, and no fevers.  No rashes.  No myalgias.  No oral ulcers. Bit by a tick last year and wonders if this may be pertinent.  No rash at tick bite site or other site in the past.  She tried aleve x 2 days when her hip hurt the worst.   Exercise: yoga  Past Medical History  Diagnosis Date  . Nephrolithiasis   . History of infectious mononucleosis     In High School.  She had hepatitis from the mono (NOT HEP B OR HEP C virus).  . History of recurrent UTIs     when she has stones esp  . Perimenopausal disorder     Past Surgical History  Procedure Laterality Date  . Breast enhancement surgery  1996   FH: no RA or Autoimmune conditions  MEDS: prevacid 36m qd, anusol HC cream prn  Allergies  Allergen Reactions  . Doxycycline Nausea And Vomiting  . Penicillins Hives    No tongue, lips, or throat swelling.  No SOB or wheezing.    ROS As per HPI  PE: Blood pressure 131/79, pulse 87, temperature 97.8 F (36.6 C), temperature source Oral, resp. rate 16, height '5\' 5"'  (1.651 m), weight 167 lb 4 oz (75.864 kg), SpO2 94 %. Gen: Alert, well appearing.  Patient is oriented to person, place, time, and  situation. EXLK:GMWN no injection, icteris, swelling, or exudate.  EOMI, PERRLA. Mouth: lips without lesion/swelling.  Oral mucosa pink and moist. Oropharynx without erythema, exudate, or swelling.  Neck - No masses or thyromegaly or limitation in range of motion.  No tenderness posteriorly. CV: RRR, no m/r/g.   LUNGS: CTA bilat, nonlabored resps, good aeration in all lung fields. EXT: no clubbing, cyanosis, or edema.  Hips: no stiffness or pain with ROM.  ROM fully intact. Knees; ROM fully intact and without pain or stiffness.  Mild R knee warmth and subtle soft tissue fullness in superior aspect of knee--bringing into question presence of small effusion vs periarticular soft tissue swelling.  Patella w/out TTP, neg patellar grind.  No crepitus. SKIN: no rash  LABS:  None today  IMPRESSION AND PLAN:  Roving polyarthralgias the last few months; presently with R knee arthritis--mild. I attempted diagnostic aspiration of R knee joint today using sterile procedures and was unable to successfully enter the joint in order to get fluid.  Pt tolerated procedure well and there were no immediate complications.   Will check CBC, CMET, ESR, CRP, lyme titers, parvo titers, and Rh factor. No additional meds rx'd today: I recommended she resume a trial of aleve 440 mg bid x  7d.  If w/u all neg and pt continues to have significant symptoms then will refer to rheumatologist.  An After Visit Summary was printed and given to the patient.  FOLLOW UP: Return for to be determined based on results of workup.

## 2015-03-14 LAB — LYME AB/WESTERN BLOT REFLEX: B burgdorferi Ab IgG+IgM: 0.27 {ISR}

## 2015-03-16 LAB — PARVOVIRUS B19 ANTIBODY, IGG AND IGM
Parovirus B19 IgG Abs: 0.3 (ref <0.9)
Parovirus B19 IgM Abs: 0.2 (ref <0.9)

## 2015-06-18 ENCOUNTER — Other Ambulatory Visit: Payer: Self-pay | Admitting: *Deleted

## 2015-06-18 DIAGNOSIS — K219 Gastro-esophageal reflux disease without esophagitis: Secondary | ICD-10-CM

## 2015-06-18 MED ORDER — LANSOPRAZOLE 15 MG PO CPDR: 15.0000 mg | DELAYED_RELEASE_CAPSULE | Freq: Every day | ORAL | Status: DC

## 2015-06-18 NOTE — Telephone Encounter (Signed)
RF request for lansoprazole LOV: 03/13/15 Next ov: None Last written: 11/20/14 #90 w/ 1RF

## 2015-07-05 DIAGNOSIS — M531 Cervicobrachial syndrome: Secondary | ICD-10-CM | POA: Diagnosis not present

## 2015-07-05 DIAGNOSIS — M546 Pain in thoracic spine: Secondary | ICD-10-CM | POA: Diagnosis not present

## 2015-07-05 DIAGNOSIS — M9901 Segmental and somatic dysfunction of cervical region: Secondary | ICD-10-CM | POA: Diagnosis not present

## 2015-07-05 DIAGNOSIS — M9902 Segmental and somatic dysfunction of thoracic region: Secondary | ICD-10-CM | POA: Diagnosis not present

## 2015-07-24 DIAGNOSIS — M9901 Segmental and somatic dysfunction of cervical region: Secondary | ICD-10-CM | POA: Diagnosis not present

## 2015-07-24 DIAGNOSIS — M9902 Segmental and somatic dysfunction of thoracic region: Secondary | ICD-10-CM | POA: Diagnosis not present

## 2015-07-24 DIAGNOSIS — M531 Cervicobrachial syndrome: Secondary | ICD-10-CM | POA: Diagnosis not present

## 2015-07-24 DIAGNOSIS — M546 Pain in thoracic spine: Secondary | ICD-10-CM | POA: Diagnosis not present

## 2015-07-25 DIAGNOSIS — M546 Pain in thoracic spine: Secondary | ICD-10-CM | POA: Diagnosis not present

## 2015-07-25 DIAGNOSIS — M9901 Segmental and somatic dysfunction of cervical region: Secondary | ICD-10-CM | POA: Diagnosis not present

## 2015-07-25 DIAGNOSIS — M9902 Segmental and somatic dysfunction of thoracic region: Secondary | ICD-10-CM | POA: Diagnosis not present

## 2015-07-25 DIAGNOSIS — M531 Cervicobrachial syndrome: Secondary | ICD-10-CM | POA: Diagnosis not present

## 2015-08-08 DIAGNOSIS — H1045 Other chronic allergic conjunctivitis: Secondary | ICD-10-CM | POA: Diagnosis not present

## 2015-10-29 DIAGNOSIS — M531 Cervicobrachial syndrome: Secondary | ICD-10-CM | POA: Diagnosis not present

## 2015-10-29 DIAGNOSIS — M546 Pain in thoracic spine: Secondary | ICD-10-CM | POA: Diagnosis not present

## 2015-10-29 DIAGNOSIS — M9902 Segmental and somatic dysfunction of thoracic region: Secondary | ICD-10-CM | POA: Diagnosis not present

## 2015-10-29 DIAGNOSIS — M9901 Segmental and somatic dysfunction of cervical region: Secondary | ICD-10-CM | POA: Diagnosis not present

## 2015-10-30 DIAGNOSIS — M546 Pain in thoracic spine: Secondary | ICD-10-CM | POA: Diagnosis not present

## 2015-10-30 DIAGNOSIS — M9901 Segmental and somatic dysfunction of cervical region: Secondary | ICD-10-CM | POA: Diagnosis not present

## 2015-10-30 DIAGNOSIS — M531 Cervicobrachial syndrome: Secondary | ICD-10-CM | POA: Diagnosis not present

## 2015-10-30 DIAGNOSIS — M9902 Segmental and somatic dysfunction of thoracic region: Secondary | ICD-10-CM | POA: Diagnosis not present

## 2015-11-06 DIAGNOSIS — M9901 Segmental and somatic dysfunction of cervical region: Secondary | ICD-10-CM | POA: Diagnosis not present

## 2015-11-06 DIAGNOSIS — M531 Cervicobrachial syndrome: Secondary | ICD-10-CM | POA: Diagnosis not present

## 2015-11-06 DIAGNOSIS — M546 Pain in thoracic spine: Secondary | ICD-10-CM | POA: Diagnosis not present

## 2015-11-06 DIAGNOSIS — M9902 Segmental and somatic dysfunction of thoracic region: Secondary | ICD-10-CM | POA: Diagnosis not present

## 2015-11-08 DIAGNOSIS — M9901 Segmental and somatic dysfunction of cervical region: Secondary | ICD-10-CM | POA: Diagnosis not present

## 2015-11-08 DIAGNOSIS — M531 Cervicobrachial syndrome: Secondary | ICD-10-CM | POA: Diagnosis not present

## 2015-11-08 DIAGNOSIS — M9902 Segmental and somatic dysfunction of thoracic region: Secondary | ICD-10-CM | POA: Diagnosis not present

## 2015-11-08 DIAGNOSIS — M546 Pain in thoracic spine: Secondary | ICD-10-CM | POA: Diagnosis not present

## 2015-11-14 DIAGNOSIS — M546 Pain in thoracic spine: Secondary | ICD-10-CM | POA: Diagnosis not present

## 2015-11-14 DIAGNOSIS — M531 Cervicobrachial syndrome: Secondary | ICD-10-CM | POA: Diagnosis not present

## 2015-11-14 DIAGNOSIS — M9901 Segmental and somatic dysfunction of cervical region: Secondary | ICD-10-CM | POA: Diagnosis not present

## 2015-11-14 DIAGNOSIS — M9902 Segmental and somatic dysfunction of thoracic region: Secondary | ICD-10-CM | POA: Diagnosis not present

## 2015-11-28 DIAGNOSIS — M546 Pain in thoracic spine: Secondary | ICD-10-CM | POA: Diagnosis not present

## 2015-11-28 DIAGNOSIS — M9902 Segmental and somatic dysfunction of thoracic region: Secondary | ICD-10-CM | POA: Diagnosis not present

## 2015-11-28 DIAGNOSIS — M531 Cervicobrachial syndrome: Secondary | ICD-10-CM | POA: Diagnosis not present

## 2015-11-28 DIAGNOSIS — M9901 Segmental and somatic dysfunction of cervical region: Secondary | ICD-10-CM | POA: Diagnosis not present

## 2016-03-17 DIAGNOSIS — M546 Pain in thoracic spine: Secondary | ICD-10-CM | POA: Diagnosis not present

## 2016-03-17 DIAGNOSIS — M9901 Segmental and somatic dysfunction of cervical region: Secondary | ICD-10-CM | POA: Diagnosis not present

## 2016-03-17 DIAGNOSIS — M9902 Segmental and somatic dysfunction of thoracic region: Secondary | ICD-10-CM | POA: Diagnosis not present

## 2016-03-17 DIAGNOSIS — M531 Cervicobrachial syndrome: Secondary | ICD-10-CM | POA: Diagnosis not present

## 2016-03-19 DIAGNOSIS — M9901 Segmental and somatic dysfunction of cervical region: Secondary | ICD-10-CM | POA: Diagnosis not present

## 2016-03-19 DIAGNOSIS — M546 Pain in thoracic spine: Secondary | ICD-10-CM | POA: Diagnosis not present

## 2016-03-19 DIAGNOSIS — M531 Cervicobrachial syndrome: Secondary | ICD-10-CM | POA: Diagnosis not present

## 2016-03-19 DIAGNOSIS — M9902 Segmental and somatic dysfunction of thoracic region: Secondary | ICD-10-CM | POA: Diagnosis not present

## 2016-03-24 ENCOUNTER — Encounter: Payer: Self-pay | Admitting: Family Medicine

## 2016-03-24 ENCOUNTER — Ambulatory Visit (INDEPENDENT_AMBULATORY_CARE_PROVIDER_SITE_OTHER): Payer: BLUE CROSS/BLUE SHIELD | Admitting: Family Medicine

## 2016-03-24 ENCOUNTER — Telehealth: Payer: Self-pay | Admitting: Family Medicine

## 2016-03-24 VITALS — BP 121/78 | HR 79 | Temp 98.0°F | Resp 16 | Ht 65.0 in | Wt 169.5 lb

## 2016-03-24 DIAGNOSIS — M542 Cervicalgia: Secondary | ICD-10-CM

## 2016-03-24 DIAGNOSIS — M9902 Segmental and somatic dysfunction of thoracic region: Secondary | ICD-10-CM | POA: Diagnosis not present

## 2016-03-24 DIAGNOSIS — M531 Cervicobrachial syndrome: Secondary | ICD-10-CM | POA: Diagnosis not present

## 2016-03-24 DIAGNOSIS — R202 Paresthesia of skin: Secondary | ICD-10-CM | POA: Diagnosis not present

## 2016-03-24 DIAGNOSIS — M9901 Segmental and somatic dysfunction of cervical region: Secondary | ICD-10-CM | POA: Diagnosis not present

## 2016-03-24 DIAGNOSIS — R200 Anesthesia of skin: Secondary | ICD-10-CM | POA: Diagnosis not present

## 2016-03-24 DIAGNOSIS — M546 Pain in thoracic spine: Secondary | ICD-10-CM | POA: Diagnosis not present

## 2016-03-24 NOTE — Telephone Encounter (Signed)
Pls call pt and assess whether she reports and NEW neurologic symptoms--there was mention of left leg tingling/numbness this morning in the nurse-line report.  If still having the left leg symptoms, I recommend she go immediately to the ED for further assessment of possible stroke. If not, ok to keep appt here this afternoon.--thx

## 2016-03-24 NOTE — Telephone Encounter (Signed)
Pt has apt today at 2:30pm.

## 2016-03-24 NOTE — Progress Notes (Signed)
Pre visit review using our clinic review tool, if applicable. No additional management support is needed unless otherwise documented below in the visit note. 

## 2016-03-24 NOTE — Patient Instructions (Signed)
Take otc aleve 2 tabs twice a day with food.
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
## 2016-03-24 NOTE — Telephone Encounter (Signed)
[facility] - Client TELEPHONE ADVICE RECORD [facility]  Patient Name: Cynthia Booth  DOB: 09/13/1960    Initial Comment Caller states, she is having trouble with neck pain - on and off for a few months - having shoulder and arm numbness. Pins and needles in left leg. Friday night she lost train of thought. Verified    Nurse Assessment  Nurse: Laural BenesJohnson, RN, Dondra SpryGail Date/Time Lamount Cohen(Eastern Time): 03/24/2016 10:26:54 AM  Confirm and document reason for call. If symptomatic, describe symptoms. ---[name], she is having trouble with neck pain - on and off for a few months - having shoulder and arm numbness (left side). Pins and needles in left leg when driving to work this am. Also notes, at a work function, Friday night she lost train of thought came back but enough to scare her.  Does the patient have any new or worsening symptoms? ---Yes  Will a triage be completed? ---Yes  Related visit to physician within the last 2 weeks? ---No  Does the PT have any chronic conditions? (i.e. diabetes, asthma, etc.) ---Unknown  Is this a behavioral health or substance abuse call? ---No     Guidelines    Guideline Title Affirmed Question Affirmed Notes  Neck Pain or Stiffness [1] SEVERE neck pain (e.g., excruciating, unable to do any normal activities) AND [2] not improved after 2 hours of pain medicine    Final Disposition User   See Physician within 4 Hours (or PCP triage) Laural BenesJohnson, RN, Dondra SpryGail    Referrals  REFERRED TO PCP OFFICE   Disagree/Comply: Danella Maiersomply

## 2016-03-24 NOTE — Telephone Encounter (Signed)
[facility] - Client TELEPHONE ADVICE RECORD [facility]  Patient Name: [name]  DOB: 09/13/1960    Initial Comment Caller states, she is having trouble with neck pain - on and off for a few months - having shoulder and arm numbness. Pins and needles in left leg. Friday night she lost train of thought. Verified    Nurse Assessment  Nurse: Laural BenesJohnson, RN, Dondra SpryGail Date/Time Lamount Cohen(Eastern Time): 03/24/2016 10:26:54 AM  Confirm and document reason for call. If symptomatic, describe symptoms. ---[name], she is having trouble with neck pain - on and off for a few months - having shoulder and arm numbness (left side). Pins and needles in left leg when driving to work this am. Also notes, at a work function, Friday night she lost train of thought came back but enough to scare her.  Does the patient have any new or worsening symptoms? ---Yes  Will a triage be completed? ---Yes  Related visit to physician within the last 2 weeks? ---No  Does the PT have any chronic conditions? (i.e. diabetes, asthma, etc.) ---Unknown  Is this a behavioral health or substance abuse call? ---No     Guidelines    Guideline Title Affirmed Question Affirmed Notes  Neck Pain or Stiffness [1] SEVERE neck pain (e.g., excruciating, unable to do any normal activities) AND [2] not improved after 2 hours of pain medicine    Final Disposition User   See Physician within 4 Hours (or PCP triage) Laural BenesJohnson, RN, Dondra SpryGail    Comments  NOTE: made appt with Dr. Cherre BlancP. McGowen at 230 today   Referrals  REFERRED TO PCP OFFICE   Disagree/Comply: Comply

## 2016-03-24 NOTE — Telephone Encounter (Signed)
Noted  

## 2016-03-24 NOTE — Telephone Encounter (Signed)
Dr. Milinda CaveMcGowen pt.

## 2016-03-24 NOTE — Telephone Encounter (Signed)
SW pt she stated that she is not having any new neurologic symptoms. She stated that she is only has neck pain x 1 week with arm numbness. Pt advised to keep apt with Dr. Milinda CaveMcGowen this afternoon.

## 2016-04-10 ENCOUNTER — Ambulatory Visit: Payer: BLUE CROSS/BLUE SHIELD | Admitting: Family Medicine

## 2016-04-17 ENCOUNTER — Encounter: Payer: Self-pay | Admitting: Family Medicine

## 2016-04-17 ENCOUNTER — Ambulatory Visit (INDEPENDENT_AMBULATORY_CARE_PROVIDER_SITE_OTHER): Payer: BLUE CROSS/BLUE SHIELD | Admitting: Family Medicine

## 2016-04-17 VITALS — BP 133/83 | HR 99 | Temp 97.3°F | Resp 16 | Wt 168.0 lb

## 2016-04-17 DIAGNOSIS — B349 Viral infection, unspecified: Secondary | ICD-10-CM | POA: Diagnosis not present

## 2016-04-17 DIAGNOSIS — J069 Acute upper respiratory infection, unspecified: Secondary | ICD-10-CM | POA: Diagnosis not present

## 2016-04-17 MED ORDER — AZITHROMYCIN 250 MG PO TABS: ORAL_TABLET | ORAL | 0 refills | Status: DC

## 2016-04-17 NOTE — Progress Notes (Signed)
Pre visit review using our clinic review tool, if applicable. No additional management support is needed unless otherwise documented below in the visit note. 

## 2016-04-17 NOTE — Progress Notes (Signed)
OFFICE VISIT  04/17/2016   CC:  Chief Complaint  Patient presents with  . URI    head conggestioin, runny nose, cough    HPI:    Patient is a 56 y.o. Caucasian female who presents for respiratory complaints. Onset 3 d/a with n/v/d x 24h--this cleared up. Then she started getting runny nose, ST, sneezing, chills/sweats but no temp checked.  No coughing. Some body aches and fatigue.  No chest tightness, SOB, or wheezing.    Past Medical History:  Diagnosis Date  . History of infectious mononucleosis    In High School.  She had hepatitis from the mono (NOT HEP B OR HEP C virus).  . History of recurrent UTIs    when she has stones esp  . Nephrolithiasis   . Perimenopausal disorder     Past Surgical History:  Procedure Laterality Date  . BREAST ENHANCEMENT SURGERY  1996    Outpatient Medications Prior to Visit  Medication Sig Dispense Refill  . Omeprazole Magnesium 20.6 (20 Base) MG CPDR Take 1 capsule by mouth daily.     No facility-administered medications prior to visit.     Allergies  Allergen Reactions  . Doxycycline Nausea And Vomiting  . Penicillins Hives    No tongue, lips, or throat swelling.  No SOB or wheezing.    ROS As per HPI  PE: Blood pressure 133/83, pulse 99, temperature 97.3 F (36.3 C), temperature source Oral, resp. rate 16, weight 168 lb (76.2 kg), SpO2 96 %. Gen: Alert, well appearing.  Patient is oriented to person, place, time, and situation. AFFECT: jovial HEENT: eyes without injection, drainage, or swelling.  Ears: EACs clear, TMs with normal light reflex and landmarks.  Nose: Clear rhinorrhea, with some dried, crusty exudate adherent to mildly injected mucosa.  No purulent d/c.  No paranasal sinus TTP.  No facial swelling.  Throat and mouth without focal lesion.  No pharyngial swelling, erythema, or exudate.   Neck: supple, no LAD.   LUNGS: CTA bilat, nonlabored resps.   CV: RRR, no m/r/g. EXT: no c/c/e SKIN: no rash  LABS:   none  IMPRESSION AND PLAN:  Viral syndrome: early on she had prominent GI sx's, then turned into URI. No suspicion of LRTI or influenza or significant bacterial infection. However, pt is adamant that "every time this happens it doesn't go away and I have to come back in for an antibiotic". She asks that I rx her Z-pack to see if this helps at this juncture, so I agreed to give this a try. Get otc generic robitussin DM OR Mucinex DM and use as directed on the packaging for cough and congestion. Use otc generic saline nasal spray 2-3 times per day to irrigate/moisturize your nasal passages.  An After Visit Summary was printed and given to the patient.  FOLLOW UP: Return if symptoms worsen or fail to improve.  Signed:  Santiago BumpersPhil McGowen, MD           04/17/2016

## 2016-05-20 ENCOUNTER — Encounter: Payer: Self-pay | Admitting: Family Medicine

## 2016-05-20 ENCOUNTER — Telehealth: Payer: Self-pay | Admitting: Family Medicine

## 2016-05-20 ENCOUNTER — Ambulatory Visit (INDEPENDENT_AMBULATORY_CARE_PROVIDER_SITE_OTHER): Payer: BLUE CROSS/BLUE SHIELD | Admitting: Family Medicine

## 2016-05-20 VITALS — BP 102/69 | HR 82 | Temp 98.0°F | Resp 16 | Ht 65.0 in | Wt 168.8 lb

## 2016-05-20 DIAGNOSIS — R6889 Other general symptoms and signs: Secondary | ICD-10-CM | POA: Diagnosis not present

## 2016-05-20 DIAGNOSIS — R110 Nausea: Secondary | ICD-10-CM | POA: Diagnosis not present

## 2016-05-20 DIAGNOSIS — R0789 Other chest pain: Secondary | ICD-10-CM | POA: Diagnosis not present

## 2016-05-20 DIAGNOSIS — R51 Headache: Principal | ICD-10-CM

## 2016-05-20 DIAGNOSIS — R519 Headache, unspecified: Secondary | ICD-10-CM

## 2016-05-20 DIAGNOSIS — R531 Weakness: Secondary | ICD-10-CM | POA: Diagnosis not present

## 2016-05-20 DIAGNOSIS — R202 Paresthesia of skin: Secondary | ICD-10-CM | POA: Diagnosis not present

## 2016-05-20 DIAGNOSIS — M542 Cervicalgia: Secondary | ICD-10-CM

## 2016-05-20 DIAGNOSIS — M5412 Radiculopathy, cervical region: Secondary | ICD-10-CM

## 2016-05-20 DIAGNOSIS — G44219 Episodic tension-type headache, not intractable: Secondary | ICD-10-CM | POA: Diagnosis not present

## 2016-05-20 DIAGNOSIS — M79609 Pain in unspecified limb: Secondary | ICD-10-CM

## 2016-05-20 LAB — COMPREHENSIVE METABOLIC PANEL
ALK PHOS: 78 U/L (ref 39–117)
ALT: 17 U/L (ref 0–35)
AST: 16 U/L (ref 0–37)
Albumin: 4.4 g/dL (ref 3.5–5.2)
BILIRUBIN TOTAL: 0.4 mg/dL (ref 0.2–1.2)
BUN: 11 mg/dL (ref 6–23)
CO2: 30 meq/L (ref 19–32)
CREATININE: 0.74 mg/dL (ref 0.40–1.20)
Calcium: 9.8 mg/dL (ref 8.4–10.5)
Chloride: 107 mEq/L (ref 96–112)
GFR: 86.38 mL/min (ref >60.00)
GLUCOSE: 105 mg/dL — AB (ref 70–99)
Potassium: 4.4 mEq/L (ref 3.5–5.1)
SODIUM: 142 meq/L (ref 135–145)
TOTAL PROTEIN: 7.3 g/dL (ref 6.0–8.3)

## 2016-05-20 LAB — TSH: TSH: 0.72 u[IU]/mL (ref 0.35–4.50)

## 2016-05-20 LAB — CBC WITH DIFFERENTIAL/PLATELET
BASOS ABS: 0.1 10*3/uL (ref 0.0–0.1)
Basophils Relative: 0.9 % (ref 0.0–3.0)
EOS ABS: 0.1 10*3/uL (ref 0.0–0.7)
Eosinophils Relative: 0.7 % (ref 0.0–5.0)
HCT: 43 % (ref 36.0–46.0)
HEMOGLOBIN: 14.3 g/dL (ref 12.0–15.0)
LYMPHS ABS: 2.3 10*3/uL (ref 0.7–4.0)
Lymphocytes Relative: 23.2 % (ref 12.0–46.0)
MCHC: 33.3 g/dL (ref 30.0–36.0)
MCV: 90.7 fl (ref 78.0–100.0)
MONO ABS: 0.5 10*3/uL (ref 0.1–1.0)
Monocytes Relative: 5.4 % (ref 3.0–12.0)
NEUTROS PCT: 69.8 % (ref 43.0–77.0)
Neutro Abs: 6.8 10*3/uL (ref 1.4–7.7)
Platelets: 283 10*3/uL (ref 150.0–400.0)
RBC: 4.74 Mil/uL (ref 3.87–5.11)
RDW: 13.9 % (ref 11.5–15.5)
WBC: 9.8 10*3/uL (ref 4.0–10.5)

## 2016-05-20 LAB — SEDIMENTATION RATE: Sed Rate: 5 mm/hr (ref 0–30)

## 2016-05-20 LAB — C-REACTIVE PROTEIN: CRP: 0.3 mg/dL — ABNORMAL LOW (ref 0.5–20.0)

## 2016-05-20 NOTE — Telephone Encounter (Signed)
Left message for pt to call back  °

## 2016-05-20 NOTE — Progress Notes (Signed)
Pre visit review using our clinic review tool, if applicable. No additional management support is needed unless otherwise documented below in the visit note. 

## 2016-05-20 NOTE — Telephone Encounter (Signed)
Pls contact pt and tell her that the MRI of her neck was not approved by her insurance b/c she has not tried PT or pain medication first. Tell her that due to the constellation of symptoms she has, I think the best next step is referral to neurology. I'll submit referral now.--thx

## 2016-05-20 NOTE — Progress Notes (Signed)
OFFICE VISIT  05/20/2016   CC:  Chief Complaint  Patient presents with  . Neck Pain    left side, headache, nausea, pressure on chest x 2-3 months   HPI:    Patient is a 56 y.o. Caucasian female who presents for neck pain on left side--intermittent x 2-3 mo. Now says she has continued to have some intermittent L sided neck pain, with associated L side of neck, L trapezius region, and L upper arm tingling/numbness.  When these things come on, they are followed shortly after by feeling clammy, gets a severe, throbbing HA, nausea--usually w/out vomiting, feels a pressure on chest when these episodes occur but not a chest pain.  No SOB, no jaw pain.  Gets sensation of being freezing and turns pale.  Sometimes wakes up from sleep with these episodes.  In between these episodes she feels normal.  Episodes: approximately 4 total in the last couple months.  Denies any focal weakness.  No radicular pain in legs or arms--just the tingling/numbness as noted above No cognitive deficits with these episodes.  No visual changes or hearing changes.     Of note, I saw her 03/24/16 for neck pain with left arm tingling/numbness symptoms and intermittent left leg paresthesias.   Ordered MRI C spine but pt has not gone to get this.  Also was supposed to f/u in 2 weeks but she did not. She went to chiropracter and says this made her feel better so she canceled the MRI. Of note, I saw her about a month later for URI and she made no mention of her previous neck pain and paresthesias or episodes noted above.  Past Medical History:  Diagnosis Date  . History of infectious mononucleosis    In High School.  She had hepatitis from the mono (NOT HEP B OR HEP C virus).  . History of recurrent UTIs    when she has stones esp  . Nephrolithiasis   . Perimenopausal disorder     Past Surgical History:  Procedure Laterality Date  . BREAST ENHANCEMENT SURGERY  1996   MEDS: not taking azithromycin listed  below. Outpatient Medications Prior to Visit  Medication Sig Dispense Refill  . Omeprazole Magnesium 20.6 (20 Base) MG CPDR Take 1 capsule by mouth daily.    Marland Kitchen azithromycin (ZITHROMAX) 250 MG tablet 2 tabs po qd x 1d, then 1 tab po qd x 4d (Patient not taking: Reported on 05/20/2016) 6 tablet 0   No facility-administered medications prior to visit.     Allergies  Allergen Reactions  . Doxycycline Nausea And Vomiting  . Penicillins Hives    No tongue, lips, or throat swelling.  No SOB or wheezing.    ROS As per HPI  PE: Blood pressure 102/69, pulse 82, temperature 98 F (36.7 C), temperature source Oral, resp. rate 16, height _0  (1.651 m), weight 168 lb 12 oz (76.5 kg), SpO2 96 %. Gen: Alert, well appearing.  Patient is oriented to person, place, time, and situation. AFFECT: pleasant, lucid thought and speech. GSU:PJSR: no injection, icteris, swelling, or exudate.  EOMI, PERRLA. Mouth: lips without lesion/swelling.  Oral mucosa pink and moist. Oropharynx without erythema, exudate, or swelling.  Neck - No masses or thyromegaly or limitation in range of motion CV: RRR, no m/r/g.   LUNGS: CTA bilat, nonlabored resps, good aeration in all lung fields. EXT: no clubbing, cyanosis, or edema.  Neuro: CN 2-12 intact bilaterally, strength 5/5 in proximal and distal upper extremities and lower  extremities bilaterally.  No sensory deficits.  No tremor.  FNF normal bilat.  No ataxia.  Upper extremity and lower extremity DTRs symmetric.  No pronator drift.   LABS:    Chemistry      Component Value Date/Time   NA 141 03/13/2015 1113   K 3.7 03/13/2015 1113   CL 105 03/13/2015 1113   CO2 29 03/13/2015 1113   BUN 12 03/13/2015 1113   CREATININE 0.74 03/13/2015 1113      Component Value Date/Time   CALCIUM 9.7 03/13/2015 1113   ALKPHOS 79 03/13/2015 1113   AST 17 03/13/2015 1113   ALT 17 03/13/2015 1113   BILITOT 0.3 03/13/2015 1113     Lab Results  Component Value Date   WBC 9.1  03/13/2015   HGB 14.6 03/13/2015   HCT 42.6 03/13/2015   MCV 89.5 03/13/2015   PLT 286.0 03/13/2015   12 lead EKG today (no prior EKG for comparison) :  NSR, rate 69, no ectopy, no ischemic changes, normal intervals and QRS duration. Low voltage lead III.  IMPRESSION AND PLAN:  Neurologic episodes, etiology unknown. What seems to start things off is left sided neck pain and the paresthesias/numbness described in HPI. No hx of migraine or tension type headaches. This could be atypical presentation of migraine with aura. Would still want to r/o c-spine DDD with nerve compression first, esp since these symptoms could be panic symptoms in response to her neck/paresthesias. Labs today: CBC w/diff, CMET, TSH, ESR, CRP. EKG today normal/reassuring. C spine MRI w/out contrast reordered. Neuro referral if all neg.  (? New onset migraine with aura?).  Spent 45 min with pt today, with >50% of this time spent in counseling and care coordination regarding the above problems.  An After Visit Summary was printed and given to the patient.  FOLLOW UP: Return for to be determined based on results of w/u.  Signed:  Crissie Sickles, MD           05/20/2016

## 2016-05-21 NOTE — Telephone Encounter (Signed)
Pt advised and voiced understanding.   

## 2016-05-30 ENCOUNTER — Telehealth: Payer: Self-pay | Admitting: Family Medicine

## 2016-05-30 ENCOUNTER — Encounter: Payer: Self-pay | Admitting: Neurology

## 2016-05-30 NOTE — Telephone Encounter (Signed)
Tim LairSuandrea, can you try to get her in with [facility] earlier than 08/11/16? Let me know-thx

## 2016-05-30 NOTE — Telephone Encounter (Signed)
Patient states she was referred to neurology by pcp.  She is scheduled to see Dr. Everlena CooperJaffe on 08/11/16.  She wants to know if pcp thinks it is ok to wait this long to see neurology or should she try to get in sooner with another neurology group.

## 2016-05-30 NOTE — Telephone Encounter (Signed)
[facility] is currently closed, they close at 12pm on Fridays.  I will call their office on Monday in attempt to get an sooner appt.

## 2016-05-30 NOTE — Telephone Encounter (Signed)
Please advise. Thanks.  

## 2016-06-02 NOTE — Telephone Encounter (Signed)
Left message with referral coordinator at [facility] to see if patient could be seen sooner per Dr. Samul DadaMcGowen's request.

## 2016-06-02 NOTE — Telephone Encounter (Signed)
Received call back from [place] with [facility], they were able to schedule patient an appt on 06/04/16 @ 8:30am with Dr. Terrace ArabiaYan.  Left patient message for call back to provide appt info.

## 2016-06-02 NOTE — Telephone Encounter (Signed)
This is super!  Thanks so much SeychellesSuandrea! --PM

## 2016-06-04 ENCOUNTER — Telehealth: Payer: Self-pay | Admitting: *Deleted

## 2016-06-04 ENCOUNTER — Ambulatory Visit: Payer: Self-pay | Admitting: Neurology

## 2016-06-04 NOTE — Telephone Encounter (Signed)
No showed new patient appointment. 

## 2016-06-05 ENCOUNTER — Encounter: Payer: Self-pay | Admitting: Neurology

## 2016-06-17 DIAGNOSIS — Z1231 Encounter for screening mammogram for malignant neoplasm of breast: Secondary | ICD-10-CM | POA: Diagnosis not present

## 2016-06-17 LAB — HM MAMMOGRAPHY

## 2016-06-19 ENCOUNTER — Encounter: Payer: Self-pay | Admitting: Family Medicine

## 2016-08-11 ENCOUNTER — Ambulatory Visit: Payer: BLUE CROSS/BLUE SHIELD | Admitting: Neurology

## 2017-01-14 ENCOUNTER — Telehealth: Payer: Self-pay | Admitting: Family Medicine

## 2017-01-14 ENCOUNTER — Ambulatory Visit: Payer: Self-pay

## 2017-01-14 DIAGNOSIS — R0789 Other chest pain: Secondary | ICD-10-CM | POA: Diagnosis not present

## 2017-01-14 DIAGNOSIS — K219 Gastro-esophageal reflux disease without esophagitis: Secondary | ICD-10-CM | POA: Diagnosis not present

## 2017-01-14 NOTE — Telephone Encounter (Signed)
Patient called in with c/o "chest pain x 10 days, feeling like pressure, heart burn." Denies radiating pain down arms, but stated "I've noticed both of my arms hurt from my elbow up to my shoulders when I raise my arm over my head deep in the bone of my elbows." She says the pain is worse while she is exerting herself, rates a mild pain, but eases when she is off work at home resting on the couch.  She reports being on OTC medicine for heart burn from time to time, but nothing recently.  Per protocol, she was instructed to go to the ED immediately, she stated "I will go pick my sister up and both of us will go to the ED to be checked out."

## 2017-01-14 NOTE — Telephone Encounter (Signed)
Noted agree

## 2017-01-14 NOTE — Telephone Encounter (Signed)
FYI

## 2017-01-14 NOTE — Telephone Encounter (Signed)
  Reason for Disposition . SEVERE chest pain  Answer Assessment - Initial Assessment Questions 1. LOCATION: "Where does it hurt?"       Center of chest, pressure, heart burn 2. RADIATION: "Does the pain go anywhere else?" (e.g., into neck, jaw, arms, back)     Both arms soreness felt in between elbow and shoulder 3. ONSET: "When did the chest pain begin?" (Minutes, hours or days)      10 days ago off and on 4. PATTERN "Does the pain come and go, or has it been constant since it started?"  "Does it get worse with exertion?"      Yes it comes and goes and gets worse with any exertion 5. DURATION: "How long does it last" (e.g., seconds, minutes, hours)     It hasn't gone away since 10am today 6. SEVERITY: "How bad is the pain?"  (e.g., Scale 1-10; mild, moderate, or severe)    - MILD (1-3): doesn't interfere with normal activities     - MODERATE (4-7): interferes with normal activities or awakens from sleep    - SEVERE (8-10): excruciating pain, unable to do any normal activities       Mild 1-3 7. CARDIAC RISK FACTORS: "Do you have any history of heart problems or risk factors for heart disease?" (e.g., prior heart attack, angina; high blood pressure, diabetes, being overweight, high cholesterol, smoking, or strong family history of heart disease)    Denies andy risk factors; father passed away of quadruple byass, brother-heart stent placed, another brother-open heart surgery, another brother on heart medicine. 8. PULMONARY RISK FACTORS: "Do you have any history of lung disease?"  (e.g., blood clots in lung, asthma, emphysema, birth control pills)     Denies 9. CAUSE: "What do you think is causing the chest pain?"     Unsure 10. OTHER SYMPTOMS: "Do you have any other symptoms?" (e.g., dizziness, nausea, vomiting, sweating, fever, difficulty breathing, cough)       Feels like heartburn and nausea at times, have cough that's been present in last 3 days. 11. PREGNANCY: "Is there any chance you  are pregnant?" "When was your last menstrual period?"       No-menopausal  Protocols used: CHEST PAIN-A-AH

## 2017-01-14 NOTE — Telephone Encounter (Signed)
Copied from CRM [id number]. Topic: General - Other >> Jan 13, 2017  4:00 PM Arlyss Gandyichardson, Taren N, NT wrote: Reason for CRM: [facility] and would like to transfer care to that office. Will this be ok?  >> Jan 13, 2017  4:33 PM Harmon PierFulton, Amanda I wrote: Please advise DR Justun Anaya   This is fine.-thx

## 2017-02-23 ENCOUNTER — Encounter: Payer: Self-pay | Admitting: Family Medicine

## 2017-02-23 ENCOUNTER — Ambulatory Visit: Payer: BLUE CROSS/BLUE SHIELD | Admitting: Family Medicine

## 2017-02-23 VITALS — BP 118/74 | HR 97 | Temp 97.9°F | Ht 65.0 in | Wt 172.4 lb

## 2017-02-23 DIAGNOSIS — R05 Cough: Secondary | ICD-10-CM

## 2017-02-23 DIAGNOSIS — K219 Gastro-esophageal reflux disease without esophagitis: Secondary | ICD-10-CM | POA: Diagnosis not present

## 2017-02-23 DIAGNOSIS — R059 Cough, unspecified: Secondary | ICD-10-CM

## 2017-02-23 MED ORDER — OMEPRAZOLE 40 MG PO CPDR: 40.0000 mg | DELAYED_RELEASE_CAPSULE | Freq: Every day | ORAL | 0 refills | Status: DC

## 2017-02-23 MED ORDER — METHYLPREDNISOLONE ACETATE 80 MG/ML IJ SUSP
80.0000 mg | Freq: Once | INTRAMUSCULAR | Status: AC
  Administered 2017-02-23: 80 mg via INTRAMUSCULAR

## 2017-02-23 MED ORDER — AZITHROMYCIN 250 MG PO TABS: ORAL_TABLET | ORAL | 0 refills | Status: DC

## 2017-02-23 MED ORDER — IPRATROPIUM BROMIDE 0.06 % NA SOLN: 2.0000 | Freq: Four times a day (QID) | NASAL | 0 refills | Status: DC

## 2017-02-23 MED ORDER — BENZONATATE 200 MG PO CAPS: 200.0000 mg | ORAL_CAPSULE | Freq: Two times a day (BID) | ORAL | 0 refills | Status: DC | PRN

## 2017-02-23 NOTE — Addendum Note (Signed)
Addended by: Koleen DistanceAGNER, AMBER M on: 02/23/2017 11:45 AM   Modules accepted: Orders

## 2017-02-23 NOTE — Patient Instructions (Signed)
Start the atrovent.  Start tessalon for your cough.  Start the zpack if your symptoms worsen or do not improve in a few days.  Please stay well hydrated.  You can take tylenol and/or motrin as needed for low grade fever and pain.  Please let me know if your symptoms worsen or fail to improve.  Take care, Dr Kellen Hover  

## 2017-02-23 NOTE — Progress Notes (Signed)
    Subjective:  Cynthia Booth is a 57 y.o. female who presents today for same-day appointment with a chief complaint of cough.   HPI:  Cough, Acute Issue Symptoms started about 4 days ago. Worsened over that time. Associated symptoms include rhinorrhea, chest congestion, sore throat, malaise, and myalgias. Symptoms are worse at night. Tried theraflu which has not helped significantly helped. No obvious alleviating or aggravating factors.   GERD, Established Problem.  Stable on omeprazole. Requests refill today.    ROS: Per HPI  PMH: She reports that  has never smoked. she has never used smokeless tobacco. She reports that she does not drink alcohol or use drugs.  Objective:  Physical Exam: BP 118/74 (BP Location: Left Arm, Patient Position: Sitting, Cuff Size: Normal)   Pulse 97   Temp 97.9 F (36.6 C) (Oral)   Ht 5\' 5"  (1.651 m)   Wt 172 lb 6.4 oz (78.2 kg)   SpO2 98%   BMI 28.69 kg/m   Gen: NAD, resting comfortably HEENT: TMs are clear effusion bilaterally.  Oropharynx clear without exudate.  Maxillary sinuses with mildly decreased transillumination bilaterally.  No lymphadenopathy. CV: RRR with no murmurs appreciated Pulm: NWOB, CTAB with no crackles, wheezes, or rhonchi GI: Normal bowel sounds present. Soft, Nontender, Nondistended.  Assessment/Plan:  Cough Likely secondary to viral URI. No signs of bacterial infection. Start atrovent for rhinorrhea/sinus congestion. Will give 80mg  IM depo-medrol for sore throat. Start tessalon for cough. Sent in a "pocket prescription" for azithromycin with strict instruction to not start unless symptoms worse or fail to improve within the next several days. Recommended tylenol and/or motrin as needed for low grade fever and pain. Encouraged good oral hydration. Return precautions reviewed. Follow up as needed.   GERD Omeprazole refilled.   Katina Degreealeb M. Jimmey RalphParker, MD 02/23/2017 11:24 AM

## 2017-03-21 ENCOUNTER — Other Ambulatory Visit: Payer: Self-pay | Admitting: Family Medicine

## 2017-03-22 ENCOUNTER — Other Ambulatory Visit: Payer: Self-pay | Admitting: Family Medicine

## 2017-04-06 DIAGNOSIS — M9901 Segmental and somatic dysfunction of cervical region: Secondary | ICD-10-CM | POA: Diagnosis not present

## 2017-04-06 DIAGNOSIS — M4722 Other spondylosis with radiculopathy, cervical region: Secondary | ICD-10-CM | POA: Diagnosis not present

## 2017-04-06 DIAGNOSIS — M4724 Other spondylosis with radiculopathy, thoracic region: Secondary | ICD-10-CM | POA: Diagnosis not present

## 2017-04-06 DIAGNOSIS — M9902 Segmental and somatic dysfunction of thoracic region: Secondary | ICD-10-CM | POA: Diagnosis not present

## 2017-04-07 DIAGNOSIS — M9901 Segmental and somatic dysfunction of cervical region: Secondary | ICD-10-CM | POA: Diagnosis not present

## 2017-04-07 DIAGNOSIS — M9902 Segmental and somatic dysfunction of thoracic region: Secondary | ICD-10-CM | POA: Diagnosis not present

## 2017-04-07 DIAGNOSIS — M4722 Other spondylosis with radiculopathy, cervical region: Secondary | ICD-10-CM | POA: Diagnosis not present

## 2017-04-07 DIAGNOSIS — M4724 Other spondylosis with radiculopathy, thoracic region: Secondary | ICD-10-CM | POA: Diagnosis not present

## 2017-04-21 DIAGNOSIS — M9901 Segmental and somatic dysfunction of cervical region: Secondary | ICD-10-CM | POA: Diagnosis not present

## 2017-04-21 DIAGNOSIS — M4722 Other spondylosis with radiculopathy, cervical region: Secondary | ICD-10-CM | POA: Diagnosis not present

## 2017-04-21 DIAGNOSIS — M4724 Other spondylosis with radiculopathy, thoracic region: Secondary | ICD-10-CM | POA: Diagnosis not present

## 2017-04-21 DIAGNOSIS — M9902 Segmental and somatic dysfunction of thoracic region: Secondary | ICD-10-CM | POA: Diagnosis not present

## 2017-05-20 DIAGNOSIS — M4722 Other spondylosis with radiculopathy, cervical region: Secondary | ICD-10-CM | POA: Diagnosis not present

## 2017-05-20 DIAGNOSIS — M9902 Segmental and somatic dysfunction of thoracic region: Secondary | ICD-10-CM | POA: Diagnosis not present

## 2017-05-20 DIAGNOSIS — M4724 Other spondylosis with radiculopathy, thoracic region: Secondary | ICD-10-CM | POA: Diagnosis not present

## 2017-05-20 DIAGNOSIS — M9901 Segmental and somatic dysfunction of cervical region: Secondary | ICD-10-CM | POA: Diagnosis not present

## 2017-05-23 ENCOUNTER — Other Ambulatory Visit: Payer: Self-pay | Admitting: Family Medicine

## 2017-06-18 ENCOUNTER — Other Ambulatory Visit: Payer: Self-pay | Admitting: Family Medicine

## 2017-06-27 ENCOUNTER — Other Ambulatory Visit: Payer: Self-pay | Admitting: Family Medicine

## 2017-08-25 DIAGNOSIS — M9901 Segmental and somatic dysfunction of cervical region: Secondary | ICD-10-CM | POA: Diagnosis not present

## 2017-08-25 DIAGNOSIS — M4722 Other spondylosis with radiculopathy, cervical region: Secondary | ICD-10-CM | POA: Diagnosis not present

## 2017-08-25 DIAGNOSIS — M4724 Other spondylosis with radiculopathy, thoracic region: Secondary | ICD-10-CM | POA: Diagnosis not present

## 2017-08-25 DIAGNOSIS — M9902 Segmental and somatic dysfunction of thoracic region: Secondary | ICD-10-CM | POA: Diagnosis not present

## 2017-08-26 DIAGNOSIS — M4724 Other spondylosis with radiculopathy, thoracic region: Secondary | ICD-10-CM | POA: Diagnosis not present

## 2017-08-26 DIAGNOSIS — M9902 Segmental and somatic dysfunction of thoracic region: Secondary | ICD-10-CM | POA: Diagnosis not present

## 2017-08-26 DIAGNOSIS — M4722 Other spondylosis with radiculopathy, cervical region: Secondary | ICD-10-CM | POA: Diagnosis not present

## 2017-08-26 DIAGNOSIS — M9901 Segmental and somatic dysfunction of cervical region: Secondary | ICD-10-CM | POA: Diagnosis not present

## 2017-08-27 DIAGNOSIS — M9902 Segmental and somatic dysfunction of thoracic region: Secondary | ICD-10-CM | POA: Diagnosis not present

## 2017-08-27 DIAGNOSIS — M4722 Other spondylosis with radiculopathy, cervical region: Secondary | ICD-10-CM | POA: Diagnosis not present

## 2017-08-27 DIAGNOSIS — M4724 Other spondylosis with radiculopathy, thoracic region: Secondary | ICD-10-CM | POA: Diagnosis not present

## 2017-08-27 DIAGNOSIS — M9901 Segmental and somatic dysfunction of cervical region: Secondary | ICD-10-CM | POA: Diagnosis not present

## 2017-10-07 DIAGNOSIS — M4724 Other spondylosis with radiculopathy, thoracic region: Secondary | ICD-10-CM | POA: Diagnosis not present

## 2017-10-07 DIAGNOSIS — M4722 Other spondylosis with radiculopathy, cervical region: Secondary | ICD-10-CM | POA: Diagnosis not present

## 2017-10-07 DIAGNOSIS — M9901 Segmental and somatic dysfunction of cervical region: Secondary | ICD-10-CM | POA: Diagnosis not present

## 2017-10-07 DIAGNOSIS — M9902 Segmental and somatic dysfunction of thoracic region: Secondary | ICD-10-CM | POA: Diagnosis not present

## 2017-10-08 DIAGNOSIS — M4724 Other spondylosis with radiculopathy, thoracic region: Secondary | ICD-10-CM | POA: Diagnosis not present

## 2017-10-08 DIAGNOSIS — M9901 Segmental and somatic dysfunction of cervical region: Secondary | ICD-10-CM | POA: Diagnosis not present

## 2017-10-08 DIAGNOSIS — M9902 Segmental and somatic dysfunction of thoracic region: Secondary | ICD-10-CM | POA: Diagnosis not present

## 2017-10-08 DIAGNOSIS — M4722 Other spondylosis with radiculopathy, cervical region: Secondary | ICD-10-CM | POA: Diagnosis not present

## 2017-10-14 ENCOUNTER — Encounter: Payer: Self-pay | Admitting: Family Medicine

## 2017-10-14 ENCOUNTER — Ambulatory Visit (INDEPENDENT_AMBULATORY_CARE_PROVIDER_SITE_OTHER): Payer: BLUE CROSS/BLUE SHIELD | Admitting: Family Medicine

## 2017-10-14 VITALS — BP 114/76 | HR 80 | Temp 98.5°F | Resp 16 | Ht 65.0 in | Wt 169.0 lb

## 2017-10-14 DIAGNOSIS — E663 Overweight: Secondary | ICD-10-CM

## 2017-10-14 DIAGNOSIS — Z0001 Encounter for general adult medical examination with abnormal findings: Secondary | ICD-10-CM | POA: Diagnosis not present

## 2017-10-14 DIAGNOSIS — F410 Panic disorder [episodic paroxysmal anxiety] without agoraphobia: Secondary | ICD-10-CM

## 2017-10-14 DIAGNOSIS — Z23 Encounter for immunization: Secondary | ICD-10-CM

## 2017-10-14 DIAGNOSIS — Z124 Encounter for screening for malignant neoplasm of cervix: Secondary | ICD-10-CM

## 2017-10-14 DIAGNOSIS — Z0000 Encounter for general adult medical examination without abnormal findings: Secondary | ICD-10-CM

## 2017-10-14 DIAGNOSIS — F411 Generalized anxiety disorder: Secondary | ICD-10-CM

## 2017-10-14 DIAGNOSIS — Z1211 Encounter for screening for malignant neoplasm of colon: Secondary | ICD-10-CM

## 2017-10-14 LAB — COMPREHENSIVE METABOLIC PANEL
ALT: 25 U/L (ref 0–35)
AST: 21 U/L (ref 0–37)
Albumin: 4.4 g/dL (ref 3.5–5.2)
Alkaline Phosphatase: 79 U/L (ref 39–117)
BUN: 13 mg/dL (ref 6–23)
CALCIUM: 9.7 mg/dL (ref 8.4–10.5)
CHLORIDE: 106 meq/L (ref 96–112)
CO2: 31 meq/L (ref 19–32)
CREATININE: 0.72 mg/dL (ref 0.40–1.20)
GFR: 88.7 mL/min (ref >60.00)
GLUCOSE: 115 mg/dL — AB (ref 70–99)
Potassium: 4.1 mEq/L (ref 3.5–5.1)
Sodium: 141 mEq/L (ref 135–145)
Total Bilirubin: 0.4 mg/dL (ref 0.2–1.2)
Total Protein: 7.2 g/dL (ref 6.0–8.3)

## 2017-10-14 LAB — CBC WITH DIFFERENTIAL/PLATELET
BASOS PCT: 0.4 % (ref 0.0–3.0)
Basophils Absolute: 0 10*3/uL (ref 0.0–0.1)
EOS PCT: 1.5 % (ref 0.0–5.0)
Eosinophils Absolute: 0.1 10*3/uL (ref 0.0–0.7)
HEMATOCRIT: 40.8 % (ref 36.0–46.0)
Hemoglobin: 14.1 g/dL (ref 12.0–15.0)
LYMPHS ABS: 2.7 10*3/uL (ref 0.7–4.0)
LYMPHS PCT: 29.9 % (ref 12.0–46.0)
MCHC: 34.5 g/dL (ref 30.0–36.0)
MCV: 90.2 fl (ref 78.0–100.0)
MONOS PCT: 6.7 % (ref 3.0–12.0)
Monocytes Absolute: 0.6 10*3/uL (ref 0.1–1.0)
NEUTROS ABS: 5.5 10*3/uL (ref 1.4–7.7)
NEUTROS PCT: 61.5 % (ref 43.0–77.0)
PLATELETS: 267 10*3/uL (ref 150.0–400.0)
RBC: 4.52 Mil/uL (ref 3.87–5.11)
RDW: 13.6 % (ref 11.5–15.5)
WBC: 9 10*3/uL (ref 4.0–10.5)

## 2017-10-14 LAB — TSH: TSH: 1.24 u[IU]/mL (ref 0.35–4.50)

## 2017-10-14 LAB — LIPID PANEL
CHOL/HDL RATIO: 4
Cholesterol: 201 mg/dL — ABNORMAL HIGH (ref 0–200)
HDL: 50.8 mg/dL (ref >39.00)
LDL CALC: 115 mg/dL — AB (ref 0–99)
NONHDL: 150.39
Triglycerides: 178 mg/dL — ABNORMAL HIGH (ref 0.0–149.0)
VLDL: 35.6 mg/dL (ref 0.0–40.0)

## 2017-10-14 MED ORDER — ESCITALOPRAM OXALATE 10 MG PO TABS
10.0000 mg | ORAL_TABLET | Freq: Every day | ORAL | 1 refills | Status: DC
Start: 2017-10-14 — End: 2017-10-20

## 2017-10-14 MED ORDER — OMEPRAZOLE 40 MG PO CPDR: DELAYED_RELEASE_CAPSULE | ORAL | 3 refills | Status: DC

## 2017-10-14 NOTE — Progress Notes (Signed)
Office Note 10/14/2017  CC:  Chief Complaint  Patient presents with  . Annual Exam    Pt is not fasting.     HPI:  Cynthia Booth is a 57 y.o. White female who is here for annual health maintenance exam.  Onset 1 yr ago when stress at work became overwhelming.  She quit work.  Stress response--blotchy skin, pulsating artey on side of neck, left ear pain and numbness.  Upper back hurting.  These sx's initially were present only when upset, but more recently it is constant.  Chiropractor no help.   Denies depressed.  She is irritable.  She worries about everything.  Energy ok.  Some periods of poor concentration. Has never been on anything in the past for stress.  Past Medical History:  Diagnosis Date  . History of infectious mononucleosis    In High School.  She had hepatitis from the mono (NOT HEP B OR HEP C virus).  . History of recurrent UTIs    when she has stones esp  . Nephrolithiasis   . Perimenopausal disorder     Past Surgical History:  Procedure Laterality Date  . BREAST ENHANCEMENT SURGERY  1996  . COLONOSCOPY     approx 2005--in the context of "kidney stone" illness (pt doesn't recall name of MD or office).    Family History  Problem Relation Age of Onset  . Cancer Brother        NHL  . Hypertension Maternal Grandmother     Social History   Socioeconomic History  . Marital status: Single    Spouse name: Not on file  . Number of children: Not on file  . Years of education: Not on file  . Highest education level: Not on file  Occupational History  . Not on file  Social Needs  . Financial resource strain: Not on file  . Food insecurity:    Worry: Not on file    Inability: Not on file  . Transportation needs:    Medical: Not on file    Non-medical: Not on file  Tobacco Use  . Smoking status: Never Smoker  . Smokeless tobacco: Never Used  Substance and Sexual Activity  . Alcohol use: No  . Drug use: No  . Sexual activity: Not on file   Lifestyle  . Physical activity:    Days per week: Not on file    Minutes per session: Not on file  . Stress: Not on file  Relationships  . Social connections:    Talks on phone: Not on file    Gets together: Not on file    Attends religious service: Not on file    Active member of club or organization: Not on file    Attends meetings of clubs or organizations: Not on file    Relationship status: Not on file  . Intimate partner violence:    Fear of current or ex partner: Not on file    Emotionally abused: Not on file    Physically abused: Not on file    Forced sexual activity: Not on file  Other Topics Concern  . Not on file  Social History Narrative   Single, has one son 57 yrs old.   Occupation:  Adult nurseretail construction.   Some college: [employer] in [place].   No T/A/Ds.   Typically exercises a few days a week.   Normal diet.    Outpatient Medications Prior to Visit  Medication Sig Dispense Refill  . azithromycin (  ZITHROMAX) 250 MG tablet Take 2 tabs day 1, then 1 tab daily (Patient not taking: Reported on 10/14/2017) 6 each 0  . benzonatate (TESSALON) 200 MG capsule Take 1 capsule (200 mg total) by mouth 2 (two) times daily as needed for cough. (Patient not taking: Reported on 10/14/2017) 20 capsule 0  . ipratropium (ATROVENT) 0.06 % nasal spray Place 2 sprays into both nostrils 4 (four) times daily. (Patient not taking: Reported on 10/14/2017) 15 mL 0  . omeprazole (PRILOSEC) 40 MG capsule TAKE 1 CAPSULE BY MOUTH EVERY DAY (Patient not taking: Reported on 10/14/2017) 30 capsule 0   No facility-administered medications prior to visit.     Allergies  Allergen Reactions  . Doxycycline Nausea And Vomiting  . Penicillins Hives    No tongue, lips, or throat swelling.  No SOB or wheezing.    ROS Review of Systems  Constitutional: Negative for appetite change, chills, fatigue and fever.  HENT: Negative for congestion, dental problem, ear pain and sore throat.   Eyes:  Negative for discharge, redness and visual disturbance.  Respiratory: Negative for cough, chest tightness, shortness of breath and wheezing.   Cardiovascular: Negative for chest pain, palpitations and leg swelling.  Gastrointestinal: Negative for abdominal pain, blood in stool, diarrhea, nausea and vomiting.  Genitourinary: Negative for difficulty urinating, dysuria, flank pain, frequency, hematuria and urgency.  Musculoskeletal: Positive for back pain (upper back). Negative for arthralgias, joint swelling, myalgias and neck stiffness.  Skin: Negative for pallor and rash.  Neurological: Negative for dizziness, speech difficulty, weakness and headaches.  Hematological: Negative for adenopathy. Does not bruise/bleed easily.  Psychiatric/Behavioral: Negative for confusion, dysphoric mood, self-injury, sleep disturbance and suicidal ideas. The patient is nervous/anxious.     PE; Blood pressure 114/76, pulse 80, temperature 98.5 F (36.9 C), temperature source Oral, resp. rate 16, height 5\' 5"  (1.651 m), weight 169 lb (76.7 kg), SpO2 96 %. Body mass index is 28.12 kg/m. Pt examined with Pryor Ochoa, CMA, as chaperone.  Gen: Alert, well appearing.  Patient is oriented to person, place, time, and situation. AFFECT: pleasant, lucid thought and speech. ENT: Ears: EACs clear, normal epithelium.  TMs with good light reflex and landmarks bilaterally.  Eyes: no injection, icteris, swelling, or exudate.  EOMI, PERRLA. Nose: no drainage or turbinate edema/swelling.  No injection or focal lesion.  Mouth: lips without lesion/swelling.  Oral mucosa pink and moist.  Dentition intact and without obvious caries or gingival swelling.  Oropharynx without erythema, exudate, or swelling.  Neck: supple/nontender.  No LAD, mass, or TM.  Carotid pulses 2+ bilaterally, without bruits. CV: RRR, no m/r/g.   LUNGS: CTA bilat, nonlabored resps, good aeration in all lung fields. ABD: soft, NT, ND, BS normal.  No  hepatospenomegaly or mass.  No bruits. EXT: no clubbing, cyanosis, or edema.  Musculoskeletal: no joint swelling, erythema, warmth, or tenderness.  ROM of all joints intact. Skin - no sores or suspicious lesions or rashes or color changes   Pertinent labs:  Lab Results  Component Value Date   TSH 0.72 05/20/2016   Lab Results  Component Value Date   WBC 9.8 05/20/2016   HGB 14.3 05/20/2016   HCT 43.0 05/20/2016   MCV 90.7 05/20/2016   PLT 283.0 05/20/2016   Lab Results  Component Value Date   CREATININE 0.74 05/20/2016   BUN 11 05/20/2016   NA 142 05/20/2016   K 4.4 05/20/2016   CL 107 05/20/2016   CO2 30 05/20/2016   Lab Results  Component Value Date   ALT 17 05/20/2016   AST 16 05/20/2016   ALKPHOS 78 05/20/2016   BILITOT 0.4 05/20/2016   No results found for: CHOL No results found for: HDL No results found for: LDLCALC No results found for: TRIG No results found for: CHOLHDL No results found for: WUJW1X   ASSESSMENT AND PLAN:   1) GAD with panic attacks: discussed dx.  Start generic lexapro 10mg  qd.  Therapeutic expectations and side effect profile of medication discussed today.  Patient's questions answered.   2) Health maintenance exam: Reviewed age and gender appropriate health maintenance issues (prudent diet, regular exercise, health risks of tobacco and excessive alcohol, use of seatbelts, fire alarms in home, use of sunscreen).  Also reviewed age and gender appropriate health screening as well as vaccine recommendations. Vaccines: flu -->pt declined  Tdap--> we gave pt this today.   Pt declined shingrix today. Labs: health panel labs drawn today--ate peanut butter and jelly and yogurt about 45 min ago. Cervical ca screening: no hx of any pap smear in our records.  Has not seen a gynecologist in "years".  Referred to [facility] today. Breast ca screening: last mammo was 05/2016-->she is due 2020 as per her insurance coverage.  Colon ca  screening: she has never had a screening colonoscopy--pt agrees to FIT testing and understands a colonoscopy is needed IF this returns positive.  An After Visit Summary was printed and given to the patient.  FOLLOW UP:  Return in about 4 weeks (around 11/11/2017) for GAD.  Signed:  Santiago Bumpers, MD           10/14/2017

## 2017-10-14 NOTE — Addendum Note (Signed)
Addended by: Regan RakersAY, Jair Lindblad K on: 10/14/2017 01:45 PM   Modules accepted: Orders

## 2017-10-14 NOTE — Patient Instructions (Signed)

## 2017-10-15 ENCOUNTER — Encounter: Payer: Self-pay | Admitting: *Deleted

## 2017-10-19 ENCOUNTER — Encounter: Payer: Self-pay | Admitting: Family Medicine

## 2017-10-19 NOTE — Telephone Encounter (Signed)
Please advise. Thanks.  

## 2017-10-20 MED ORDER — SERTRALINE HCL 50 MG PO TABS: 50.0000 mg | ORAL_TABLET | Freq: Every day | ORAL | 0 refills | Status: DC

## 2017-10-20 NOTE — Telephone Encounter (Signed)
OK. I eRx'd low dose sertraline. Hopefully she'll tolerate this med better.

## 2017-10-26 ENCOUNTER — Telehealth: Payer: Self-pay | Admitting: Family Medicine

## 2017-10-26 NOTE — Telephone Encounter (Signed)
Have her stop the sertraline for 5 days.  Then restart it at ONE HALF pill daily.  If tolerating this dose after 7d, then increase to 1 whole tab daily.-thx

## 2017-10-26 NOTE — Telephone Encounter (Signed)
Patient advised, voiced understanding.

## 2017-10-26 NOTE — Telephone Encounter (Signed)
Copied from CRM [id number]. Topic: Quick Communication - See Telephone Encounter >> Oct 26, 2017 10:02 AM Mare Loan F wrote: Pt is needing to talk with someone about reducing the dosage of her Zoloft-she feels very anxious and sweating pt states she feels it is too strong   Best number [phone]

## 2017-11-12 ENCOUNTER — Other Ambulatory Visit: Payer: Self-pay | Admitting: Family Medicine

## 2017-11-13 ENCOUNTER — Ambulatory Visit: Payer: BLUE CROSS/BLUE SHIELD | Admitting: Family Medicine

## 2017-11-13 ENCOUNTER — Other Ambulatory Visit: Payer: Self-pay | Admitting: Family Medicine

## 2017-11-16 ENCOUNTER — Encounter: Payer: Self-pay | Admitting: Family Medicine

## 2017-11-16 ENCOUNTER — Ambulatory Visit: Payer: BLUE CROSS/BLUE SHIELD | Admitting: Family Medicine

## 2017-11-16 VITALS — BP 122/76 | HR 75 | Temp 98.4°F | Resp 16 | Ht 65.0 in | Wt 169.1 lb

## 2017-11-16 DIAGNOSIS — F4322 Adjustment disorder with anxiety: Secondary | ICD-10-CM | POA: Diagnosis not present

## 2017-11-16 NOTE — Progress Notes (Signed)
OFFICE VISIT  11/16/2017   CC:  Chief Complaint  Patient presents with  . Follow-up    GAD   HPI:    Patient is a 57 y.o. Caucasian female who presents for 1 mo f/u GAD with several somatic/physical manifestations. I started her on lexapro but this caused n/v/d so she stopped it.  I replaced it with sertraline 50mg  qd and she has been on this for about 3 wks.  Interim Hx: Doing much better. She is splitting the 50mg  tab in half and says this is very helpful.  She has straightened some stressors out at work and this has relieved a lot of her anxiety (her overbearing boss ---pt put in her notice and this resulted in a talk with her boss, and now she is staying and he is trying to act better).  No depressed mood.  No side effects of sertraline at the 25mg  dose.  Past Medical History:  Diagnosis Date  . GAD (generalized anxiety disorder)   . History of infectious mononucleosis    In High School.  She had hepatitis from the mono (NOT HEP B OR HEP C virus).  . History of recurrent UTIs    when she has stones esp  . Nephrolithiasis   . Perimenopausal disorder     Past Surgical History:  Procedure Laterality Date  . BREAST ENHANCEMENT SURGERY  1996  . COLONOSCOPY     approx 2005--in the context of "kidney stone" illness (pt doesn't recall name of MD or office).    Outpatient Medications Prior to Visit  Medication Sig Dispense Refill  . omeprazole (PRILOSEC) 40 MG capsule TAKE 1 CAPSULE BY MOUTH EVERY DAY 90 capsule 3  . sertraline (ZOLOFT) 50 MG tablet Take 1 tablet (50 mg total) by mouth daily. 30 tablet 0   No facility-administered medications prior to visit.     Allergies  Allergen Reactions  . Doxycycline Nausea And Vomiting  . Penicillins Hives    No tongue, lips, or throat swelling.  No SOB or wheezing.    ROS As per HPI  PE: Blood pressure 122/76, pulse 75, temperature 98.4 F (36.9 C), temperature source Oral, resp. rate 16, height 5\' 5"  (1.651 m), weight 169  lb 2 oz (76.7 kg), SpO2 96 %. Wt Readings from Last 2 Encounters:  11/16/17 169 lb 2 oz (76.7 kg)  10/14/17 169 lb (76.7 kg)    Gen: alert, oriented x 4, affect pleasant.  Lucid thinking and conversation noted. HEENT: PERRLA, EOMI.   Neck: no LAD, mass, or thyromegaly. CV: RRR, no m/r/g LUNGS: CTA bilat, nonlabored. NEURO: no tremor or tics noted on observation.  Coordination intact. CN 2-12 grossly intact bilaterally, strength 5/5 in all extremeties.  No ataxia.   LABS:  Lab Results  Component Value Date   TSH 1.24 10/14/2017   Lab Results  Component Value Date   WBC 9.0 10/14/2017   HGB 14.1 10/14/2017   HCT 40.8 10/14/2017   MCV 90.2 10/14/2017   PLT 267.0 10/14/2017   Lab Results  Component Value Date   CREATININE 0.72 10/14/2017   BUN 13 10/14/2017   NA 141 10/14/2017   K 4.1 10/14/2017   CL 106 10/14/2017   CO2 31 10/14/2017   Lab Results  Component Value Date   ALT 25 10/14/2017   AST 21 10/14/2017   ALKPHOS 79 10/14/2017   BILITOT 0.4 10/14/2017   Lab Results  Component Value Date   CHOL 201 (H) 10/14/2017   Lab  Results  Component Value Date   HDL 50.80 10/14/2017   Lab Results  Component Value Date   LDLCALC 115 (H) 10/14/2017   Lab Results  Component Value Date   TRIG 178.0 (H) 10/14/2017   Lab Results  Component Value Date   CHOLHDL 4 10/14/2017   IMPRESSION AND PLAN:  GAD vs adjustment d/o with anxiety features: she is improved and we'll stay at current sertraline dosing. When it is time for RF, I'll rx it as 50mg , 1 qd, #30, so she has room to increase to whole tab if she feels the need. She is keen to ween off the med if she is still doing well at f/u in 2 months.  Spent 15 min with pt today, with >50% of this time spent in counseling and care coordination regarding the above problems.  An After Visit Summary was printed and given to the patient.  FOLLOW UP: Return in about 2 months (around 01/16/2018) for f/u anxiety.  Signed:   Santiago Bumpers, MD           11/16/2017

## 2017-12-12 ENCOUNTER — Other Ambulatory Visit: Payer: Self-pay | Admitting: Family Medicine

## 2017-12-29 ENCOUNTER — Encounter: Payer: Self-pay | Admitting: Family Medicine

## 2017-12-29 ENCOUNTER — Ambulatory Visit: Payer: BLUE CROSS/BLUE SHIELD | Admitting: Physician Assistant

## 2017-12-29 ENCOUNTER — Ambulatory Visit: Payer: BLUE CROSS/BLUE SHIELD | Admitting: Family Medicine

## 2017-12-29 VITALS — BP 130/72 | HR 111 | Temp 99.1°F | Ht 65.0 in | Wt 171.4 lb

## 2017-12-29 DIAGNOSIS — R05 Cough: Secondary | ICD-10-CM | POA: Diagnosis not present

## 2017-12-29 DIAGNOSIS — R059 Cough, unspecified: Secondary | ICD-10-CM

## 2017-12-29 MED ORDER — IPRATROPIUM BROMIDE 0.06 % NA SOLN: 2.0000 | Freq: Four times a day (QID) | NASAL | 0 refills | Status: DC

## 2017-12-29 MED ORDER — BENZONATATE 200 MG PO CAPS: 200.0000 mg | ORAL_CAPSULE | Freq: Two times a day (BID) | ORAL | 0 refills | Status: DC | PRN

## 2017-12-29 MED ORDER — METHYLPREDNISOLONE ACETATE 80 MG/ML IJ SUSP
80.0000 mg | Freq: Once | INTRAMUSCULAR | Status: AC
  Administered 2017-12-29: 80 mg via INTRAMUSCULAR

## 2017-12-29 MED ORDER — AZITHROMYCIN 250 MG PO TABS: ORAL_TABLET | ORAL | 0 refills | Status: DC

## 2017-12-29 NOTE — Progress Notes (Signed)
   Subjective:  Cynthia Booth is a 57 y.o. female who presents today for same-day appointment with a chief complaint of cough.   HPI:  Cough, Acute problem Started 2 days ago. Worsened over that time.  Associated with sore throat, sputum production, malaise, subjective fevers, and chills.  She has tried taking aspirin with no improvement.  Also with some fatigue.  She has had several sick contacts over the last week.  No obvious alleviating or aggravating factors.  ROS: Per HPI  PMH: She reports that she has never smoked. She has never used smokeless tobacco. She reports that she does not drink alcohol or use drugs.  Objective:  Physical Exam: BP 130/72 (BP Location: Left Arm, Patient Position: Sitting, Cuff Size: Normal)   Pulse (!) 111   Temp 99.1 F (37.3 C) (Oral)   Ht 5\' 5"  (1.651 m)   Wt 171 lb 6.4 oz (77.7 kg)   SpO2 95%   BMI 28.52 kg/m   Gen: NAD, resting comfortably HEENT: TMs with clear effusion.  OP erythematous without exudate.  Nose mucosa boggy and erythematous bilaterally CV: RRR with no murmurs appreciated Pulm: NWOB, CTAB with no crackles, wheezes, or rhonchi  Assessment/Plan:  Cough Likely secondary to viral URI. No signs of bacterial infection. Start atrovent for rhinorrhea/sinus congestion. Will give 80mg  IM depo-medrol for sore throat. Start Tessalon for cough. Sent in a "pocket prescription" for azithromycin with strict instruction to not start unless symptoms worsen or fail to improve within the next several days. Recommended tylenol and/or motrin as needed for low grade fever and pain. Encouraged good oral hydration. Return precautions reviewed. Follow up as needed.   Katina Degreealeb M. Jimmey RalphParker, MD 12/29/2017 4:36 PM

## 2017-12-29 NOTE — Patient Instructions (Signed)
Start the atrovent.  Start tessalon for your cough.  Start the zpack if your symptoms worsen or do not improve in a few days.  Please stay well hydrated.  You can take tylenol and/or motrin as needed for low grade fever and pain.  Please let me know if your symptoms worsen or fail to improve.  Take care, Dr Parker  

## 2018-01-04 ENCOUNTER — Ambulatory Visit: Payer: BLUE CROSS/BLUE SHIELD | Admitting: Family Medicine

## 2018-01-04 ENCOUNTER — Encounter: Payer: Self-pay | Admitting: Family Medicine

## 2018-01-04 VITALS — BP 130/84 | HR 72 | Temp 98.1°F | Resp 16 | Ht 65.0 in | Wt 168.0 lb

## 2018-01-04 DIAGNOSIS — J209 Acute bronchitis, unspecified: Secondary | ICD-10-CM | POA: Diagnosis not present

## 2018-01-04 DIAGNOSIS — B349 Viral infection, unspecified: Secondary | ICD-10-CM | POA: Diagnosis not present

## 2018-01-04 DIAGNOSIS — F4322 Adjustment disorder with anxiety: Secondary | ICD-10-CM | POA: Diagnosis not present

## 2018-01-04 MED ORDER — PREDNISONE 20 MG PO TABS: ORAL_TABLET | ORAL | 0 refills | Status: DC

## 2018-01-04 MED ORDER — ALBUTEROL SULFATE HFA 108 (90 BASE) MCG/ACT IN AERS: INHALATION_SPRAY | RESPIRATORY_TRACT | 0 refills | Status: AC

## 2018-01-04 NOTE — Progress Notes (Signed)
OFFICE VISIT  01/04/2018   CC:  Chief Complaint  Patient presents with  . Follow-up    GAD   HPI:    Patient is a 57 y.o. Caucasian female who presents for 6 week f/u GAD vs adjustment d/o with anxious mood. Last visit she was significantly improved on just 25mg  sertraline and some things at work had changed for the better. Assessment and Plan:  lan as of last visit:  she is improved and we'll stay at current sertraline dosing. When it is time for RF, I'll rx it as 50mg , 1 qd, #30, so she has room to increase to whole tab if she feels the need. She is keen to ween off the med if she is still doing well at f/u in 2 months.  Six days ago:  Had cough, n/v/d, was seen at [facility] and was rx'd Z pack, had steroid injection, taking tessalon pearls.  Feels some SOB, chest tightness, and wheezing.  Still tired but over the n/v. Still having diarrhea.  Less volume.  No blood in stool.  Some abd cramping.   She missed 3 days of work.    Interim hx: Anxiety has diminished quite a bit.  She has about 1 mo left of meds at a dose of 1/2 of 50 mg tab qd. Still stressed at work but dealing with it better consistently.  Mood is good. She is functioning well, without sleep or appetite problems.  Certainly not having any panic attacks.  Past Medical History:  Diagnosis Date  . GAD (generalized anxiety disorder)   . History of infectious mononucleosis    In High School.  She had hepatitis from the mono (NOT HEP B OR HEP C virus).  . History of recurrent UTIs    when she has stones esp  . Nephrolithiasis   . Perimenopausal disorder     Past Surgical History:  Procedure Laterality Date  . BREAST ENHANCEMENT SURGERY  1996  . COLONOSCOPY     approx 2005--in the context of "kidney stone" illness (pt doesn't recall name of MD or office).    Outpatient Medications Prior to Visit  Medication Sig Dispense Refill  . benzonatate (TESSALON) 200 MG capsule Take 1 capsule (200 mg total)  by mouth 2 (two) times daily as needed for cough. 20 capsule 0  . ipratropium (ATROVENT) 0.06 % nasal spray Place 2 sprays into both nostrils 4 (four) times daily. 15 mL 0  . omeprazole (PRILOSEC) 40 MG capsule TAKE 1 CAPSULE BY MOUTH EVERY DAY 90 capsule 3  . sertraline (ZOLOFT) 50 MG tablet TAKE 1 TABLET BY MOUTH EVERY DAY 30 tablet 0  . azithromycin (ZITHROMAX) 250 MG tablet Take 2 tabs day 1, then 1 tab daily (Patient not taking: Reported on 01/04/2018) 6 each 0   No facility-administered medications prior to visit.     Allergies  Allergen Reactions  . Doxycycline Nausea And Vomiting  . Penicillins Hives    No tongue, lips, or throat swelling.  No SOB or wheezing.    ROS As per HPI  PE: Blood pressure 130/84, pulse 72, temperature 98.1 F (36.7 C), temperature source Oral, resp. rate 16, height 5\' 5"  (1.651 m), weight 168 lb (76.2 kg), SpO2 96 %. VS: noted--normal. Gen: alert, NAD, NONTOXIC APPEARING. HEENT: eyes without injection, drainage, or swelling.  Ears: EACs clear, TMs with normal light reflex and landmarks.  Nose: No active rhinorrhea.  She has some dried, crusty exudate adherent to mildly injected mucosa.  No purulent d/c.  No paranasal sinus TTP.  No facial swelling.  Throat and mouth without focal lesion.  No pharyngial swelling, erythema, or exudate.   Neck: supple, no LAD.   LUNGS: CTA bilat, nonlabored resps.  Easy coughing with talking or laughing and with deep breaths.  Good aeration. CV: RRR, no m/r/g. EXT: no c/c/e SKIN: no rash  LABS:    Chemistry      Component Value Date/Time   NA 141 10/14/2017 1341   K 4.1 10/14/2017 1341   CL 106 10/14/2017 1341   CO2 31 10/14/2017 1341   BUN 13 10/14/2017 1341   CREATININE 0.72 10/14/2017 1341      Component Value Date/Time   CALCIUM 9.7 10/14/2017 1341   ALKPHOS 79 10/14/2017 1341   AST 21 10/14/2017 1341   ALT 25 10/14/2017 1341   BILITOT 0.4 10/14/2017 1341      IMPRESSION AND PLAN:  1) viral  syndrome, with bronchitis and mild RAD sx's. Plan: prednisone 40mg  qd x 5d. Ventolin HFA 2p q4h prn. GI sx's nearly all resolved.  2) Adjustment d/o with anxious mood: doing well. She hasn't been on sertraline all that long and has only been on low dose. She will finish the last month of med and see how she does off of it.  An After Visit Summary was printed and given to the patient.  FOLLOW UP: Return if symptoms worsen or fail to improve.  Signed:  Santiago BumpersPhil Tuesday Terlecki, MD           01/04/2018

## 2018-01-14 ENCOUNTER — Ambulatory Visit: Payer: BLUE CROSS/BLUE SHIELD | Admitting: Family Medicine

## 2018-01-15 ENCOUNTER — Encounter: Payer: Self-pay | Admitting: Family Medicine

## 2018-01-21 ENCOUNTER — Other Ambulatory Visit: Payer: Self-pay

## 2018-01-21 MED ORDER — IPRATROPIUM BROMIDE 0.06 % NA SOLN: 2.0000 | Freq: Four times a day (QID) | NASAL | 0 refills | Status: DC

## 2018-01-22 ENCOUNTER — Other Ambulatory Visit: Payer: Self-pay | Admitting: Family Medicine

## 2018-08-25 ENCOUNTER — Ambulatory Visit: Payer: BLUE CROSS/BLUE SHIELD | Admitting: Family Medicine

## 2018-08-25 ENCOUNTER — Other Ambulatory Visit: Payer: Self-pay

## 2018-08-25 DIAGNOSIS — Z20828 Contact with and (suspected) exposure to other viral communicable diseases: Secondary | ICD-10-CM | POA: Diagnosis not present

## 2019-02-01 ENCOUNTER — Telehealth: Payer: Self-pay

## 2019-02-01 NOTE — Telephone Encounter (Signed)
Pt has been scheduled for virtual tomorrow, will be coming in office to leave urine sample.

## 2019-02-01 NOTE — Telephone Encounter (Signed)
Patient is having pain with urination. Requesting an appointment.

## 2019-02-02 ENCOUNTER — Ambulatory Visit: Payer: BLUE CROSS/BLUE SHIELD | Admitting: Family Medicine

## 2019-02-02 ENCOUNTER — Encounter: Payer: Self-pay | Admitting: Family Medicine

## 2019-02-02 ENCOUNTER — Other Ambulatory Visit: Payer: Self-pay

## 2019-02-02 DIAGNOSIS — N3001 Acute cystitis with hematuria: Secondary | ICD-10-CM | POA: Diagnosis not present

## 2019-02-02 DIAGNOSIS — R300 Dysuria: Secondary | ICD-10-CM | POA: Diagnosis not present

## 2019-02-02 LAB — POC URINALSYSI DIPSTICK (AUTOMATED)
Bilirubin, UA: NEGATIVE
Blood, UA: 10
Glucose, UA: NEGATIVE
Ketones, UA: NEGATIVE
Nitrite, UA: NEGATIVE
Protein, UA: NEGATIVE
Spec Grav, UA: 1.02 (ref 1.010–1.025)
Urobilinogen, UA: 0.2 E.U./dL
pH, UA: 6 (ref 5.0–8.0)

## 2019-02-02 MED ORDER — OMEPRAZOLE 40 MG PO CPDR: DELAYED_RELEASE_CAPSULE | ORAL | 3 refills | Status: DC

## 2019-02-02 MED ORDER — SULFAMETHOXAZOLE-TRIMETHOPRIM 800-160 MG PO TABS: 1.0000 | ORAL_TABLET | Freq: Two times a day (BID) | ORAL | 0 refills | Status: DC

## 2019-02-02 NOTE — Progress Notes (Signed)
Virtual Visit via Video Note  I connected with pt on 02/02/19 at  4:00 PM EST by telephone b/c video enabled telemedicine application not available to pt and verified that I am speaking with the correct person using two identifiers.  Location patient: home Location provider:work or home office Persons participating in the virtual visit: patient, provider  I discussed the limitations of evaluation and management by telemedicine and the availability of in person appointments. The patient expressed understanding and agreed to proceed.  Telemedicine visit is a necessity given the COVID-19 restrictions in place at the current time.  HPI: 59 y/o WF with whom I am doing a telephone visit today (due to COVID-19 pandemic restrictions) painful urination. Onset a week ago or so, burning, urgency, frequency--not improving with her "home remedy" of monistat. No vag d/c.  No nausea, abd pain, side pain, or back/CVA pain. No fevers or malaise.    ROS: See pertinent positives and negatives per HPI.  Past Medical History:  Diagnosis Date  . GAD (generalized anxiety disorder)   . History of infectious mononucleosis    In High School.  She had hepatitis from the mono (NOT HEP B OR HEP C virus).  . History of recurrent UTIs    when she has stones esp  . Nephrolithiasis   . Perimenopausal disorder     Past Surgical History:  Procedure Laterality Date  . BREAST ENHANCEMENT SURGERY  1996  . COLONOSCOPY     approx 2005--in the context of "kidney stone" illness (pt doesn't recall name of MD or office).    Family History  Problem Relation Age of Onset  . Cancer Brother        NHL  . Hypertension Maternal Grandmother     SOCIAL HX:  Social History   Socioeconomic History  . Marital status: Single    Spouse name: Not on file  . Number of children: Not on file  . Years of education: Not on file  . Highest education level: Not on file  Occupational History  . Not on file  Tobacco Use  .  Smoking status: Never Smoker  . Smokeless tobacco: Never Used  Substance and Sexual Activity  . Alcohol use: No  . Drug use: No  . Sexual activity: Not on file  Other Topics Concern  . Not on file  Social History Narrative   Single, has one son 20 yrs old.   Occupation:  Adult nurse.   Some college: [employer] in [place].   No T/A/Ds.   Typically exercises a few days a week.   Normal diet.   Social Determinants of Health   Financial Resource Strain:   . Difficulty of Paying Living Expenses: Not on file  Food Insecurity:   . Worried About Programme researcher, broadcasting/film/video in the Last Year: Not on file  . Ran Out of Food in the Last Year: Not on file  Transportation Needs:   . Lack of Transportation (Medical): Not on file  . Lack of Transportation (Non-Medical): Not on file  Physical Activity:   . Days of Exercise per Week: Not on file  . Minutes of Exercise per Session: Not on file  Stress:   . Feeling of Stress : Not on file  Social Connections:   . Frequency of Communication with Friends and Family: Not on file  . Frequency of Social Gatherings with Friends and Family: Not on file  . Attends Religious Services: Not on file  . Active Member of Clubs  or Organizations: Not on file  . Attends Archivist Meetings: Not on file  . Marital Status: Not on file      Current Outpatient Medications:  .  omeprazole (PRILOSEC) 40 MG capsule, TAKE 1 CAPSULE BY MOUTH EVERY DAY, Disp: 90 capsule, Rfl: 3 .  albuterol (VENTOLIN HFA) 108 (90 Base) MCG/ACT inhaler, 2 puffs q4h prn excessive coughing or for wheezing and chest tightness (Patient not taking: Reported on 02/02/2019), Disp: 1 Inhaler, Rfl: 0 .  ipratropium (ATROVENT) 0.06 % nasal spray, PLACE 2 SPRAYS INTO BOTH NOSTRILS 4 (FOUR) TIMES DAILY. (Patient not taking: Reported on 02/02/2019), Disp: 15 mL, Rfl: 1 .  sertraline (ZOLOFT) 50 MG tablet, TAKE 1 TABLET BY MOUTH EVERY DAY (Patient not taking: Reported on 02/02/2019), Disp: 30  tablet, Rfl: 0 .  sulfamethoxazole-trimethoprim (BACTRIM DS) 800-160 MG tablet, Take 1 tablet by mouth 2 (two) times daily., Disp: 10 tablet, Rfl: 0  EXAM:  VITALS per patient if applicable: There were no vitals taken for this visit.   GENERAL: alert, oriented, sounds well and in no acute distress  No further exam b/c audio visit only.   LABS:  POC CC dipstick UA that patient did earlier today (urinated here, not at home):  4+ Leuks, 1+ blood, o/w normal.  ASSESSMENT AND PLAN:  Discussed the following assessment and plan:  Acute UTI. Bactrim DS 1 bid x 3d, continue 2 more days if not completely asymptomatic in 3d. Send urine for c/s.   I discussed the assessment and treatment plan with the patient. The patient was provided an opportunity to ask questions and all were answered. The patient agreed with the plan and demonstrated an understanding of the instructions.   The patient was advised to call back or seek an in-person evaluation if the symptoms worsen or if the condition fails to improve as anticipated.  Spent 10 min with pt today, with >50% of this time spent in counseling and care coordination regarding the above problems.  F/u: if not improving.  Signed:  Crissie Sickles, MD           02/02/2019

## 2019-02-04 LAB — URINE CULTURE
MICRO NUMBER:: 10015542
SPECIMEN QUALITY:: ADEQUATE

## 2019-03-01 DIAGNOSIS — Z1231 Encounter for screening mammogram for malignant neoplasm of breast: Secondary | ICD-10-CM | POA: Diagnosis not present

## 2019-03-01 LAB — HM MAMMOGRAPHY

## 2019-03-02 ENCOUNTER — Telehealth: Payer: Self-pay

## 2019-03-02 ENCOUNTER — Encounter: Payer: Self-pay | Admitting: Family Medicine

## 2019-03-02 NOTE — Telephone Encounter (Signed)
Received results from pt's recent mammogram, abstracted and placed on PCP desk for review.

## 2019-03-07 NOTE — Telephone Encounter (Signed)
Noted. Mammogram negative.

## 2019-06-13 DIAGNOSIS — M4723 Other spondylosis with radiculopathy, cervicothoracic region: Secondary | ICD-10-CM | POA: Diagnosis not present

## 2019-06-13 DIAGNOSIS — M4724 Other spondylosis with radiculopathy, thoracic region: Secondary | ICD-10-CM | POA: Diagnosis not present

## 2019-06-13 DIAGNOSIS — M9901 Segmental and somatic dysfunction of cervical region: Secondary | ICD-10-CM | POA: Diagnosis not present

## 2019-06-13 DIAGNOSIS — M9902 Segmental and somatic dysfunction of thoracic region: Secondary | ICD-10-CM | POA: Diagnosis not present

## 2019-06-14 DIAGNOSIS — M4724 Other spondylosis with radiculopathy, thoracic region: Secondary | ICD-10-CM | POA: Diagnosis not present

## 2019-06-14 DIAGNOSIS — M4723 Other spondylosis with radiculopathy, cervicothoracic region: Secondary | ICD-10-CM | POA: Diagnosis not present

## 2019-06-14 DIAGNOSIS — M9901 Segmental and somatic dysfunction of cervical region: Secondary | ICD-10-CM | POA: Diagnosis not present

## 2019-06-14 DIAGNOSIS — M9902 Segmental and somatic dysfunction of thoracic region: Secondary | ICD-10-CM | POA: Diagnosis not present

## 2019-10-17 ENCOUNTER — Telehealth: Payer: Self-pay

## 2019-10-17 MED ORDER — PANTOPRAZOLE SODIUM 40 MG PO TBEC: 40.0000 mg | DELAYED_RELEASE_TABLET | Freq: Every day | ORAL | 11 refills | Status: DC

## 2019-10-17 NOTE — Telephone Encounter (Signed)
I'll send rx for pantoprazole---it is essentially same type of med and same strength as 40mg  omeprazole and insurance should cover it.

## 2019-10-17 NOTE — Telephone Encounter (Signed)
LM for pt to returncall

## 2019-10-17 NOTE — Telephone Encounter (Signed)
Patient returned the call and advised new medication sent in.

## 2019-10-17 NOTE — Telephone Encounter (Signed)
Patient states her insurance won't cover omeprazole (PRILOSEC) 40 MG capsule since she can get it OTC. Is there anything that can be done?

## 2019-10-17 NOTE — Telephone Encounter (Signed)
Is there anything else patient can take for heartburn or indigestion? OTC or prescription

## 2019-10-18 ENCOUNTER — Telehealth: Payer: Self-pay

## 2019-10-18 NOTE — Telephone Encounter (Signed)
Regarding her prescription pantoprazole.  She called yesterday and spoke to someone but this is still not resolved.  Prescription is not at pharmacy because insurance stating it is denied. Is it possible that she needs an prior auth?  Please advise.  [phone]

## 2019-10-18 NOTE — Telephone Encounter (Signed)
PA submitted via covermymeds. LM for pt to return call to provide update

## 2019-10-20 NOTE — Telephone Encounter (Signed)
PA denied for pantoprazole. Please advise of other medication recommendations.

## 2019-10-20 NOTE — Telephone Encounter (Signed)
Pt needs to call her insurer and ask which "proton pump inhibitor" is preferred on her formulary, otherwise we'll just keep "shooting in the dark". When she lets [place] know then i'll send in rx.  In the meantime tell her to take TWO of the otc omeprazole capsules daily.-thx

## 2019-10-21 NOTE — Telephone Encounter (Signed)
Spoke with patient and advised of recommendations. She will call back with an update

## 2019-10-21 NOTE — Telephone Encounter (Addendum)
Patient returned call with update and stated she was to try otc medication first such as pantoprazole or omeprazole. New PA submitted and approved for pantoprazole 40mg . Called patient to notify of update, will call pharmacy to provide update as well. Key # [id number]

## 2020-03-31 DIAGNOSIS — R030 Elevated blood-pressure reading, without diagnosis of hypertension: Secondary | ICD-10-CM | POA: Diagnosis not present

## 2020-03-31 DIAGNOSIS — Z881 Allergy status to other antibiotic agents status: Secondary | ICD-10-CM | POA: Diagnosis not present

## 2020-03-31 DIAGNOSIS — K130 Diseases of lips: Secondary | ICD-10-CM | POA: Diagnosis not present

## 2020-03-31 DIAGNOSIS — Z880 Allergy status to penicillin: Secondary | ICD-10-CM | POA: Diagnosis not present

## 2020-03-31 DIAGNOSIS — R220 Localized swelling, mass and lump, head: Secondary | ICD-10-CM | POA: Diagnosis not present

## 2020-07-31 DIAGNOSIS — N951 Menopausal and female climacteric states: Secondary | ICD-10-CM | POA: Diagnosis not present

## 2020-07-31 DIAGNOSIS — E8881 Metabolic syndrome: Secondary | ICD-10-CM | POA: Diagnosis not present

## 2020-07-31 DIAGNOSIS — R635 Abnormal weight gain: Secondary | ICD-10-CM | POA: Diagnosis not present

## 2020-07-31 DIAGNOSIS — E559 Vitamin D deficiency, unspecified: Secondary | ICD-10-CM | POA: Diagnosis not present

## 2020-08-08 DIAGNOSIS — F5101 Primary insomnia: Secondary | ICD-10-CM | POA: Diagnosis not present

## 2020-08-08 DIAGNOSIS — Z0000 Encounter for general adult medical examination without abnormal findings: Secondary | ICD-10-CM | POA: Diagnosis not present

## 2020-08-08 DIAGNOSIS — N951 Menopausal and female climacteric states: Secondary | ICD-10-CM | POA: Diagnosis not present

## 2020-08-08 DIAGNOSIS — R635 Abnormal weight gain: Secondary | ICD-10-CM | POA: Diagnosis not present

## 2020-08-08 DIAGNOSIS — M2550 Pain in unspecified joint: Secondary | ICD-10-CM | POA: Diagnosis not present

## 2020-08-08 DIAGNOSIS — Z124 Encounter for screening for malignant neoplasm of cervix: Secondary | ICD-10-CM | POA: Diagnosis not present

## 2020-08-08 DIAGNOSIS — Z1331 Encounter for screening for depression: Secondary | ICD-10-CM | POA: Diagnosis not present

## 2020-08-14 DIAGNOSIS — Z6829 Body mass index (BMI) 29.0-29.9, adult: Secondary | ICD-10-CM | POA: Diagnosis not present

## 2020-08-14 DIAGNOSIS — E8881 Metabolic syndrome: Secondary | ICD-10-CM | POA: Diagnosis not present

## 2020-08-21 DIAGNOSIS — Z5320 Procedure and treatment not carried out because of patient's decision for unspecified reasons: Secondary | ICD-10-CM | POA: Diagnosis not present

## 2020-08-21 DIAGNOSIS — E8881 Metabolic syndrome: Secondary | ICD-10-CM | POA: Diagnosis not present

## 2020-08-21 DIAGNOSIS — N6312 Unspecified lump in the right breast, upper inner quadrant: Secondary | ICD-10-CM | POA: Diagnosis not present

## 2020-08-21 DIAGNOSIS — Z6828 Body mass index (BMI) 28.0-28.9, adult: Secondary | ICD-10-CM | POA: Diagnosis not present

## 2020-08-22 ENCOUNTER — Other Ambulatory Visit: Payer: Self-pay | Admitting: Nurse Practitioner

## 2020-08-22 DIAGNOSIS — N6310 Unspecified lump in the right breast, unspecified quadrant: Secondary | ICD-10-CM

## 2020-08-30 DIAGNOSIS — M2550 Pain in unspecified joint: Secondary | ICD-10-CM | POA: Diagnosis not present

## 2020-08-30 DIAGNOSIS — Z6828 Body mass index (BMI) 28.0-28.9, adult: Secondary | ICD-10-CM | POA: Diagnosis not present

## 2020-09-06 DIAGNOSIS — E785 Hyperlipidemia, unspecified: Secondary | ICD-10-CM | POA: Diagnosis not present

## 2020-09-06 DIAGNOSIS — Z6828 Body mass index (BMI) 28.0-28.9, adult: Secondary | ICD-10-CM | POA: Diagnosis not present

## 2020-09-06 DIAGNOSIS — E559 Vitamin D deficiency, unspecified: Secondary | ICD-10-CM | POA: Diagnosis not present

## 2020-09-13 DIAGNOSIS — Z6828 Body mass index (BMI) 28.0-28.9, adult: Secondary | ICD-10-CM | POA: Diagnosis not present

## 2020-09-13 DIAGNOSIS — E8881 Metabolic syndrome: Secondary | ICD-10-CM | POA: Diagnosis not present

## 2020-09-28 DIAGNOSIS — Z20822 Contact with and (suspected) exposure to covid-19: Secondary | ICD-10-CM | POA: Diagnosis not present

## 2020-09-28 DIAGNOSIS — Z03818 Encounter for observation for suspected exposure to other biological agents ruled out: Secondary | ICD-10-CM | POA: Diagnosis not present

## 2020-10-11 ENCOUNTER — Ambulatory Visit
Admission: RE | Admit: 2020-10-11 | Discharge: 2020-10-11 | Disposition: A | Discharge status: Home / Self Care | Payer: BC Managed Care – PPO | Source: Ambulatory Visit | Attending: Nurse Practitioner | Admitting: Nurse Practitioner

## 2020-10-11 ENCOUNTER — Other Ambulatory Visit: Payer: Self-pay

## 2020-10-11 ENCOUNTER — Other Ambulatory Visit: Payer: Self-pay | Admitting: Nurse Practitioner

## 2020-10-11 DIAGNOSIS — N6310 Unspecified lump in the right breast, unspecified quadrant: Secondary | ICD-10-CM

## 2020-10-11 DIAGNOSIS — N6489 Other specified disorders of breast: Secondary | ICD-10-CM | POA: Diagnosis not present

## 2020-10-11 DIAGNOSIS — R928 Other abnormal and inconclusive findings on diagnostic imaging of breast: Secondary | ICD-10-CM | POA: Diagnosis not present

## 2021-01-29 ENCOUNTER — Encounter: Payer: Self-pay | Admitting: Family Medicine

## 2021-01-29 ENCOUNTER — Other Ambulatory Visit: Payer: Self-pay

## 2021-01-29 ENCOUNTER — Ambulatory Visit: Payer: BC Managed Care – PPO | Admitting: Family Medicine

## 2021-01-29 VITALS — BP 138/94 | HR 93 | Temp 97.9°F | Wt 178.4 lb

## 2021-01-29 DIAGNOSIS — B9689 Other specified bacterial agents as the cause of diseases classified elsewhere: Secondary | ICD-10-CM

## 2021-01-29 DIAGNOSIS — J208 Acute bronchitis due to other specified organisms: Secondary | ICD-10-CM | POA: Diagnosis not present

## 2021-01-29 LAB — POC COVID19 BINAXNOW: SARS Coronavirus 2 Ag: NEGATIVE

## 2021-01-29 MED ORDER — PROMETHAZINE-DM 6.25-15 MG/5ML PO SYRP: 5.0000 mL | ORAL_SOLUTION | Freq: Four times a day (QID) | ORAL | 0 refills | Status: AC | PRN

## 2021-01-29 MED ORDER — SULFAMETHOXAZOLE-TRIMETHOPRIM 800-160 MG PO TABS: 1.0000 | ORAL_TABLET | Freq: Two times a day (BID) | ORAL | 0 refills | Status: AC

## 2021-01-29 MED ORDER — GUAIFENESIN-CODEINE 100-10 MG/5ML PO SOLN
5.0000 mL | Freq: Four times a day (QID) | ORAL | 0 refills | Status: DC | PRN
Start: 2021-01-29 — End: 2021-01-29

## 2021-01-29 NOTE — Patient Instructions (Signed)
Please return if needed Schedule your annual check up with Dr. Milinda Cave.  If you have any questions or concerns, please don't hesitate to send me a message via MyChart or call the office at [phone]. Thank you for visiting with Cynthia Booth today! It's our pleasure caring for you.   Acute Bronchitis, Adult Acute bronchitis is sudden inflammation of the main airways (bronchi) that come off the windpipe (trachea) in the lungs. The swelling causes the airways to get smaller and make more mucus than normal. This can make it hard to breathe and can cause coughing or noisy breathing (wheezing). Acute bronchitis may last several weeks. The cough may last longer. Allergies, asthma, and exposure to smoke may make the condition worse. What are the causes? This condition can be caused by germs and by substances that irritate the lungs, including: Cold and flu viruses. The most common cause of this condition is the virus that causes the common cold. Bacteria. This is less common. Breathing in substances that irritate the lungs, including: Smoke from cigarettes and other forms of tobacco. Dust and pollen. Fumes from household cleaning products, gases, or burned fuel. Indoor or outdoor air pollution. What increases the risk? The following factors may make you more likely to develop this condition: A weak body's defense system, also called the immune system. A condition that affects your lungs and breathing, such as asthma. What are the signs or symptoms? Common symptoms of this condition include: Coughing. This may bring up clear, yellow, or green mucus from your lungs (sputum). Wheezing. Runny or stuffy nose. Having too much mucus in your lungs (chest congestion). Shortness of breath. Aches and pains, including sore throat or chest. How is this diagnosed? This condition is usually diagnosed based on: Your symptoms and medical history. A physical exam. You may also have other tests, including tests to  rule out other conditions, such as pneumonia. These tests include: A test of lung function. Test of a mucus sample to look for the presence of bacteria. Tests to check the oxygen level in your blood. Blood tests. Chest X-ray. How is this treated? Most cases of acute bronchitis clear up over time without treatment. Your health care provider may recommend: Drinking more fluids to help thin your mucus so it is easier to cough up. Taking inhaled medicine (inhaler) to improve air flow in and out of your lungs. Using a vaporizer or a humidifier. These are machines that add water to the air to help you breathe better. Taking a medicine that thins mucus and clears congestion (expectorant). Taking a medicine that prevents or stops coughing (cough suppressant). It is notcommon to take an antibiotic medicine for this condition. Follow these instructions at home:  Take over-the-counter and prescription medicines only as told by your health care provider. Use an inhaler, vaporizer, or humidifier as told by your health care provider. Take two teaspoons (10 mL) of honey at bedtime to lessen coughing at night. Drink enough fluid to keep your urine pale yellow. Do not use any products that contain nicotine or tobacco. These products include cigarettes, chewing tobacco, and vaping devices, such as e-cigarettes. If you need help quitting, ask your health care provider. Get plenty of rest. Return to your normal activities as told by your health care provider. Ask your health care provider what activities are safe for you. Keep all follow-up visits. This is important. How is this prevented? To lower your risk of getting this condition again: Wash your hands often with soap and water  for at least 20 seconds. If soap and water are not available, use hand sanitizer. Avoid contact with people who have cold symptoms. Try not to touch your mouth, nose, or eyes with your hands. Avoid breathing in smoke or chemical  fumes. Breathing smoke or chemical fumes will make your condition worse. Get the flu shot every year. Contact a health care provider if: Your symptoms do not improve after 2 weeks. You have trouble coughing up the mucus. Your cough keeps you awake at night. You have a fever. Get help right away if you: Cough up blood. Feel pain in your chest. Have severe shortness of breath. Faint or keep feeling like you are going to faint. Have a severe headache. Have a fever or chills that get worse. These symptoms may represent a serious problem that is an emergency. Do not wait to see if the symptoms will go away. Get medical help right away. Call your local emergency services (911 in the [place] Do not drive yourself to the hospital. Summary Acute bronchitis is inflammation of the main airways (bronchi) that come off the windpipe (trachea) in the lungs. The swelling causes the airways to get smaller and make more mucus than normal. Drinking more fluids can help thin your mucus so it is easier to cough up. Take over-the-counter and prescription medicines only as told by your health care provider. Do not use any products that contain nicotine or tobacco. These products include cigarettes, chewing tobacco, and vaping devices, such as e-cigarettes. If you need help quitting, ask your health care provider. Contact a health care provider if your symptoms do not improve after 2 weeks. This information is not intended to replace advice given to you by your health care provider. Make sure you discuss any questions you have with your health care provider. Document Revised: 05/16/2020 Document Reviewed: 05/16/2020 Elsevier Patient Education  2022 [employer].

## 2021-01-29 NOTE — Progress Notes (Signed)
Subjective  CC:  Chief Complaint  Patient presents with   Nasal Congestion   Cough    Yellow coming up, Present since 3 wks ago   Same day acute visit; PCP not available. New pt to me. Chart reviewed.   HPI: SUBJECTIVE:  Cynthia Booth is a 61 y.o. female who complains of congestion, nasal blockage, post nasal drip, cough described as barky, harsh, and productive of green sputum and denies sinus, high fevers, SOB, chest pain or significant GI symptoms. Symptoms have been present for 4-5 days. This is her second bout. Took a zpak a few week ago. Has been passing illness back and forth with mom. Of note, mom tested positive for covid 9 days ago; she is doing fine now. She denies a history of anorexia, dizziness, vomiting and wheezing. She denies a history of asthma or COPD. Patient does not smoke cigarettes.  Assessment  1. Acute bacterial bronchitis      Plan  Discussion:  Treat for bacterial bronchitis due to prolonged course and worsening symptoms. Education regarding differences between viral and bacterial infections and treatment options are discussed.  Supportive care measures are recommended.  We discussed the use of mucolytic's, decongestants, antihistamines and antitussives as needed.  Tylenol or Advil are recommended if needed.  Follow up: prn   Orders Placed This Encounter  Procedures   POC COVID-19   Meds ordered this encounter  Medications   sulfamethoxazole-trimethoprim (BACTRIM DS) 800-160 MG tablet    Sig: Take 1 tablet by mouth 2 (two) times daily.    Dispense:  10 tablet    Refill:  0   DISCONTD: guaiFENesin-codeine 100-10 MG/5ML syrup    Sig: Take 5 mLs by mouth every 6 (six) hours as needed for cough.    Dispense:  120 mL    Refill:  0   promethazine-dextromethorphan (PROMETHAZINE-DM) 6.25-15 MG/5ML syrup    Sig: Take 5 mLs by mouth 4 (four) times daily as needed for cough.    Dispense:  118 mL    Refill:  0      I reviewed the patients updated  PMH, FH, and SocHx.  Social History: Patient  reports that she has never smoked. She has never used smokeless tobacco. She reports that she does not drink alcohol and does not use drugs.  Patient Active Problem List   Diagnosis Date Noted   Bronchopneumonia 11/11/2013   Acute maxillary sinusitis 09/09/2013   Laryngopharyngeal reflux 12/13/2012   Skin lesion of face 12/13/2012    Review of Systems: Cardiovascular: negative for chest pain Respiratory: negative for SOB or hemoptysis Gastrointestinal: negative for abdominal pain Genitourinary: negative for dysuria or gross hematuria Current Meds  Medication Sig   albuterol (VENTOLIN HFA) 108 (90 Base) MCG/ACT inhaler 2 puffs q4h prn excessive coughing or for wheezing and chest tightness   promethazine-dextromethorphan (PROMETHAZINE-DM) 6.25-15 MG/5ML syrup Take 5 mLs by mouth 4 (four) times daily as needed for cough.   [DISCONTINUED] guaiFENesin-codeine 100-10 MG/5ML syrup Take 5 mLs by mouth every 6 (six) hours as needed for cough.    Objective  Vitals: BP (!) 138/94    Pulse 93    Temp 97.9 F (36.6 C) (Temporal)    Wt 178 lb 6.4 oz (80.9 kg)    SpO2 98%    BMI 29.69 kg/m  General: no acute distress , harsh cough present Psych:  Alert and oriented, normal mood and affect HEENT:  Normocephalic, atraumatic, supple neck, moist mucous membranes, mildly erythematous pharynx  without exudate, mild lymphadenopathy, supple neck Cardiovascular:  RRR without murmur. no edema Respiratory:  Good breath sounds bilaterally, CTAB with normal respiratory effort with occasional rhonchi Skin:  Warm, no rashes Neurologic:   Mental status is normal. normal gait  Commons side effects, risks, benefits, and alternatives for medications and treatment plan prescribed today were discussed, and the patient expressed understanding of the given instructions. Patient is instructed to call or message via MyChart if he/she has any questions or concerns regarding our  treatment plan. No barriers to understanding were identified. We discussed Red Flag symptoms and signs in detail. Patient expressed understanding regarding what to do in case of urgent or emergency type symptoms.  Medication list was reconciled, printed and provided to the patient in AVS. Patient instructions and summary information was reviewed with the patient as documented in the AVS. This note was prepared with assistance of Dragon voice recognition software. Occasional wrong-word or sound-a-like substitutions may have occurred due to the inherent limitations of voice recognition software

## 2021-09-02 ENCOUNTER — Telehealth: Payer: Self-pay | Admitting: Family Medicine

## 2021-09-02 ENCOUNTER — Encounter: Payer: Self-pay | Admitting: Family Medicine

## 2021-09-02 ENCOUNTER — Ambulatory Visit: Payer: BC Managed Care – PPO | Admitting: Family Medicine

## 2021-09-02 VITALS — BP 100/62 | HR 102 | Temp 97.8°F | Ht 65.0 in | Wt 177.8 lb

## 2021-09-02 DIAGNOSIS — R197 Diarrhea, unspecified: Secondary | ICD-10-CM

## 2021-09-02 LAB — CBC WITH DIFFERENTIAL/PLATELET
Basophils Absolute: 0 10*3/uL (ref 0.0–0.1)
Basophils Relative: 0.6 % (ref 0.0–3.0)
Eosinophils Absolute: 0.1 10*3/uL (ref 0.0–0.7)
Eosinophils Relative: 2 % (ref 0.0–5.0)
HCT: 45 % (ref 36.0–46.0)
Hemoglobin: 15.4 g/dL — ABNORMAL HIGH (ref 12.0–15.0)
Lymphocytes Relative: 24.4 % (ref 12.0–46.0)
Lymphs Abs: 1.7 10*3/uL (ref 0.7–4.0)
MCHC: 34.2 g/dL (ref 30.0–36.0)
MCV: 92.8 fl (ref 78.0–100.0)
Monocytes Absolute: 0.8 10*3/uL (ref 0.1–1.0)
Monocytes Relative: 11.4 % (ref 3.0–12.0)
Neutro Abs: 4.3 10*3/uL (ref 1.4–7.7)
Neutrophils Relative %: 61.6 % (ref 43.0–77.0)
Platelets: 255 10*3/uL (ref 150.0–400.0)
RBC: 4.85 Mil/uL (ref 3.87–5.11)
RDW: 13.8 % (ref 11.5–15.5)
WBC: 7 10*3/uL (ref 4.0–10.5)

## 2021-09-02 NOTE — Telephone Encounter (Signed)
Ok with me 

## 2021-09-02 NOTE — Telephone Encounter (Signed)
Pt seeing Dr Mardelle Matte today for acute visit.   Patietn would like to [facility] from Dr Milinda Cave to Dr Mardelle Matte due to bad experience.   Patient aware Dr Mardelle Matte is not accepting new patients at the moment. Would like to wait for Dr Nyra Jabs response.

## 2021-09-02 NOTE — Progress Notes (Signed)
Subjective  CC:  Chief Complaint  Patient presents with   Diarrhea    Pt stated t hat she has had diarrhea for the past 4 days and has not gotten any better and she has gotten weak. Has been drinking a lot of water to keep hydrated.     HPI: Cynthia Booth is a 61 y.o. female who presents to the office today to address the problems listed above in the chief complaint. 61 year old female, healthy presents with diarrhea that started abruptly 3 to 4 days ago.  Has yellow-green watery stools immediately after eating or drinking.  Many times throughout the day.  Will also awaken throughout the night.  Mild abdominal bloating and cramping prior to defecation but no localized abdominal pain.  No blood in the stool but occasionally has seen mucus.  She has normal appetite without nausea or vomiting.  She has had no fevers.  No history of GI problems.  Did have an egg salad sandwich prior to the onset but no other changes in diet, recent travel or recent antibiotic use.  She has no history of diverticular disease.  She does have a history of a normal colonoscopy about 10 years ago.  She is starting to feel little bit weak in spite of keeping hydrated and eating well.  She denies palpitations or lightheadedness. Assessment  1. Diarrhea, unspecified type       Plan  Diarrhea, infectious or inflammatory: Start evaluation with blood work and GI pathogen panel.  Treat supportively with fluids and Imodium AD over-the-counter.  Will recheck in 72 hours or so if persist for further evaluation and treatment.  May want antibiotics versus GI referral.  Education given. Patient to transfer care to me.  I will except her and see her in 3 months for complete physical.  I see her mother.  Follow up: Return in about 3 months (around 12/03/2021) for complete physical.  Visit date not found  Orders Placed This Encounter  Procedures   CBC with Differential/Platelet   Comprehensive metabolic panel   Lipase    Gastrointestinal Pathogen Pnl RT, PCR   No orders of the defined types were placed in this encounter.     I reviewed the patients updated PMH, FH, and SocHx.    Patient Active Problem List   Diagnosis Date Noted   Bronchopneumonia 11/11/2013   Acute maxillary sinusitis 09/09/2013   Laryngopharyngeal reflux 12/13/2012   Skin lesion of face 12/13/2012   No outpatient medications have been marked as taking for the 09/02/21 encounter (Office Visit) with Willow Ora, MD.    Allergies: Patient is allergic to doxycycline and penicillins. Family History: Patient family history includes Cancer in her brother; Hypertension in her maternal grandmother. Social History:  Patient  reports that she has never smoked. She has never used smokeless tobacco. She reports that she does not drink alcohol and does not use drugs.  Review of Systems: Constitutional: Negative for fever malaise or anorexia Cardiovascular: negative for chest pain Respiratory: negative for SOB or persistent cough Gastrointestinal: negative for abdominal pain  Objective  Vitals: BP 100/62   Pulse (!) 102   Temp 97.8 F (36.6 C)   Ht 5\' 5"  (1.651 m)   Wt 177 lb 12.8 oz (80.6 kg)   SpO2 95%   BMI 29.59 kg/m  General: no acute distress , A&Ox3, appears well HEENT: PEERL, conjunctiva normal, neck is supple Cardiovascular:  RRR without murmur or gallop.  Respiratory:  Good breath sounds  bilaterally, CTAB with normal respiratory effort Abdomen, quiet soft nontender no masses Skin:  Warm, no rashes    Commons side effects, risks, benefits, and alternatives for medications and treatment plan prescribed today were discussed, and the patient expressed understanding of the given instructions. Patient is instructed to call or message via MyChart if he/she has any questions or concerns regarding our treatment plan. No barriers to understanding were identified. We discussed Red Flag symptoms and signs in detail. Patient  expressed understanding regarding what to do in case of urgent or emergency type symptoms.  Medication list was reconciled, printed and provided to the patient in AVS. Patient instructions and summary information was reviewed with the patient as documented in the AVS. This note was prepared with assistance of Dragon voice recognition software. Occasional wrong-word or sound-a-like substitutions may have occurred due to the inherent limitations of voice recognition software  This visit occurred during the SARS-CoV-2 public health emergency.  Safety protocols were in place, including screening questions prior to the visit, additional usage of staff PPE, and extensive cleaning of exam room while observing appropriate contact time as indicated for disinfecting solutions.

## 2021-09-02 NOTE — Patient Instructions (Signed)
Please return in 3-4 months for complete physical, establish care with me.   I will release your lab results to you on your MyChart account with further instructions. You may see the results before I do, but when I review them I will send you a message with my report or have my assistant call you if things need to be discussed. Please reply to my message with any questions. Thank you!   Start immodium AD to help control your symptoms. Let me know if they are not improving in the next 3 days.   If you have any questions or concerns, please don't hesitate to send me a message via MyChart or call the office at [phone]. Thank you for visiting with [place] today! It's our pleasure caring for you.   Diarrhea, Adult Diarrhea is frequent loose and watery bowel movements. Diarrhea can make you feel weak and cause you to become dehydrated. Dehydration can make you tired and thirsty, cause you to have a dry mouth, and decrease how often you urinate. Diarrhea typically lasts 2-3 days. However, it can last longer if it is a sign of something more serious. It is important to treat your diarrhea as told by your health care provider. Follow these instructions at home: Eating and drinking     Follow these recommendations as told by your health care provider: Take an oral rehydration solution (ORS). This is an over-the-counter medicine that helps return your body to its normal balance of nutrients and water. It is found at pharmacies and retail stores. Drink plenty of fluids, such as water, ice chips, diluted fruit juice, and low-calorie sports drinks. You can drink milk also, if desired. Avoid drinking fluids that contain a lot of sugar or caffeine, such as energy drinks, sports drinks, and soda. Eat bland, easy-to-digest foods in small amounts as you are able. These foods include bananas, applesauce, rice, lean meats, toast, and crackers. Avoid alcohol. Avoid spicy or fatty foods.  Medicines Take  over-the-counter and prescription medicines only as told by your health care provider. If you were prescribed an antibiotic medicine, take it as told by your health care provider. Do not stop using the antibiotic even if you start to feel better. General instructions  Wash your hands often using soap and water. If soap and water are not available, use a hand sanitizer. Others in the household should wash their hands as well. Hands should be washed: After using the toilet or changing a diaper. Before preparing, cooking, or serving food. While caring for a sick person or while visiting someone in a hospital. Drink enough fluid to keep your urine pale yellow. Rest at home while you recover. Watch your condition for any changes. Take a warm bath to relieve any burning or pain from frequent diarrhea episodes. Keep all follow-up visits as told by your health care provider. This is important. Contact a health care provider if: You have a fever. Your diarrhea gets worse. You have new symptoms. You cannot keep fluids down. You feel light-headed or dizzy. You have a headache. You have muscle cramps. Get help right away if: You have chest pain. You feel extremely weak or you faint. You have bloody or black stools or stools that look like tar. You have severe pain, cramping, or bloating in your abdomen. You have trouble breathing or you are breathing very quickly. Your heart is beating very quickly. Your skin feels cold and clammy. You feel confused. You have signs of dehydration, such as: Dark  urine, very little urine, or no urine. Cracked lips. Dry mouth. Sunken eyes. Sleepiness. Weakness. Summary Diarrhea is frequent loose and sometimes watery bowel movements. Diarrhea can make you feel weak and cause you to become dehydrated. Drink enough fluids to keep your urine pale yellow. Make sure that you wash your hands after using the toilet. If soap and water are not available, use hand  sanitizer. Contact a health care provider if your diarrhea gets worse or you have new symptoms. Get help right away if you have signs of dehydration. This information is not intended to replace advice given to you by your health care provider. Make sure you discuss any questions you have with your health care provider. Document Revised: 04/05/2021 Document Reviewed: 07/25/2020 Elsevier Patient Education  2023 [employer].

## 2021-09-03 LAB — COMPREHENSIVE METABOLIC PANEL
ALT: 23 U/L (ref 0–35)
AST: 20 U/L (ref 0–37)
Albumin: 4.3 g/dL (ref 3.5–5.2)
Alkaline Phosphatase: 74 U/L (ref 39–117)
BUN: 14 mg/dL (ref 6–23)
CO2: 23 mEq/L (ref 19–32)
Calcium: 9.6 mg/dL (ref 8.4–10.5)
Chloride: 105 mEq/L (ref 96–112)
Creatinine, Ser: 1.09 mg/dL (ref 0.40–1.20)
GFR: 55.03 mL/min — ABNORMAL LOW (ref >60.00)
Glucose, Bld: 140 mg/dL — ABNORMAL HIGH (ref 70–99)
Potassium: 4.5 mEq/L (ref 3.5–5.1)
Sodium: 146 mEq/L — ABNORMAL HIGH (ref 135–145)
Total Bilirubin: 0.4 mg/dL (ref 0.2–1.2)
Total Protein: 7.3 g/dL (ref 6.0–8.3)

## 2021-09-03 LAB — LIPASE: Lipase: 34 U/L (ref 11.0–59.0)

## 2021-09-04 ENCOUNTER — Telehealth: Payer: Self-pay | Admitting: Family Medicine

## 2021-09-04 ENCOUNTER — Other Ambulatory Visit: Payer: Self-pay

## 2021-09-04 DIAGNOSIS — R197 Diarrhea, unspecified: Secondary | ICD-10-CM

## 2021-09-04 LAB — GASTROINTESTINAL PATHOGEN PNL
CampyloBacter Group: DETECTED — AB
Norovirus GI/GII: NOT DETECTED
Rotavirus A: NOT DETECTED
Salmonella species: NOT DETECTED
Shiga Toxin 1: NOT DETECTED
Shiga Toxin 2: NOT DETECTED
Shigella Species: NOT DETECTED
Vibrio Group: NOT DETECTED
Yersinia enterocolitica: NOT DETECTED

## 2021-09-04 LAB — ISOLATE REFERRAL PH
MICRO NUMBER:: 13752265
SPECIMEN QUALITY:: ADEQUATE

## 2021-09-04 MED ORDER — AZITHROMYCIN 500 MG PO TABS: ORAL_TABLET | ORAL | 0 refills | Status: AC

## 2021-09-04 NOTE — Progress Notes (Signed)
Please call patient: I have reviewed his/her lab results. Her stool report shows she has a bacterial infection caused the organism, campylobacter. Most of the time, this infection clears on its own, however IF she is still having many diarrheal stools, we can treat with a 3 day course of azithromycin 500mg . IF she has not resolved, please order for her.

## 2021-09-04 NOTE — Telephone Encounter (Signed)
Patient returned Cynthia Booth's call. Patient requests to be called at ph# [phone].

## 2021-09-04 NOTE — Telephone Encounter (Signed)
Noted - see result note 

## 2021-09-04 NOTE — Telephone Encounter (Signed)
Caller was [place] from [employer] states:  - Patient's gastrointestinal pathogen panel detected campylo bacter - She has sent a fax with this information while on the line

## 2021-09-04 NOTE — Telephone Encounter (Signed)
I have spoke with pt regarding results and Rx azithromycin 500mg  has been sent in to the pharmacy

## 2021-12-24 NOTE — Telephone Encounter (Signed)
Can TOC be completed during patient's CPE scheduled with Dr. Mardelle Matte? Please advise.

## 2021-12-30 ENCOUNTER — Encounter: Payer: Self-pay | Admitting: Family Medicine

## 2022-01-09 ENCOUNTER — Encounter: Payer: Self-pay | Admitting: *Deleted

## 2022-06-11 IMAGING — MG MM  DIGITAL DIAGNOSTIC BREAST BILAT IMPLANT W/ TOMO W/ CAD
8 of 14 series · 8 of 34 positions shown · non-contrast
Comparison: Previous exam(s).

ACR Breast Density Category a: The breast tissue is almost entirely
fatty.

CLINICAL DATA: 60-year-old female with palpable thickening in the
UPPER INNER RIGHT breast discovered on clinical examination. Also
for annual bilateral mammogram.

EXAM:
DIGITAL DIAGNOSTIC BILATERAL MAMMOGRAM WITH IMPLANTS, CAD AND
TOMOSYNTHESIS; ULTRASOUND RIGHT BREAST LIMITED
TECHNIQUE: Bilateral digital diagnostic mammography and breast tomosynthesis
was performed. The images were evaluated with computer-aided
detection. Standard and/or implant displaced views were performed.;
Targeted ultrasound examination of the right breast was performed

[R MLO]
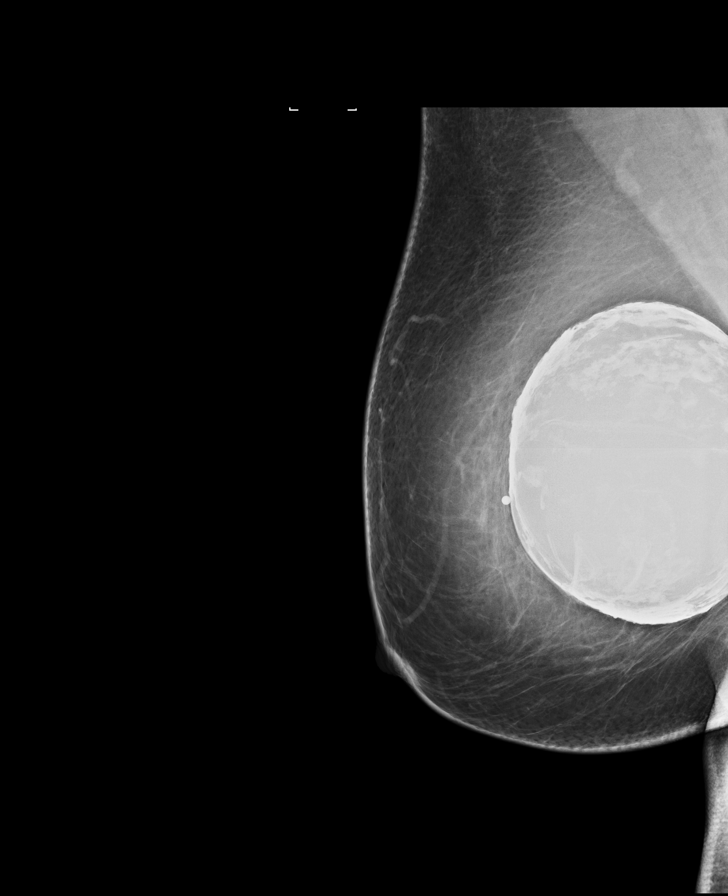

[L MLO]
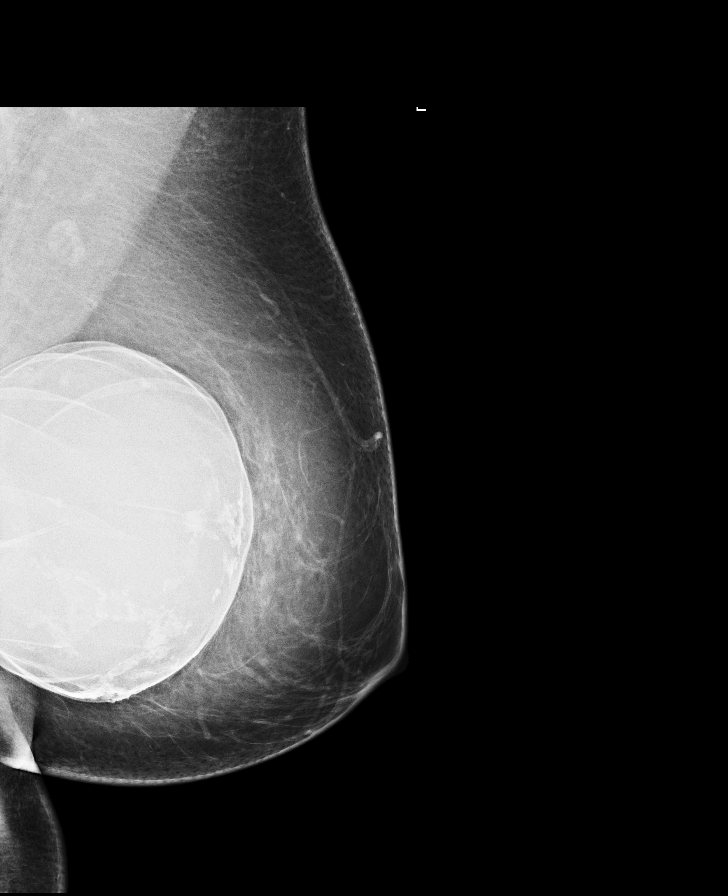

[L CC]
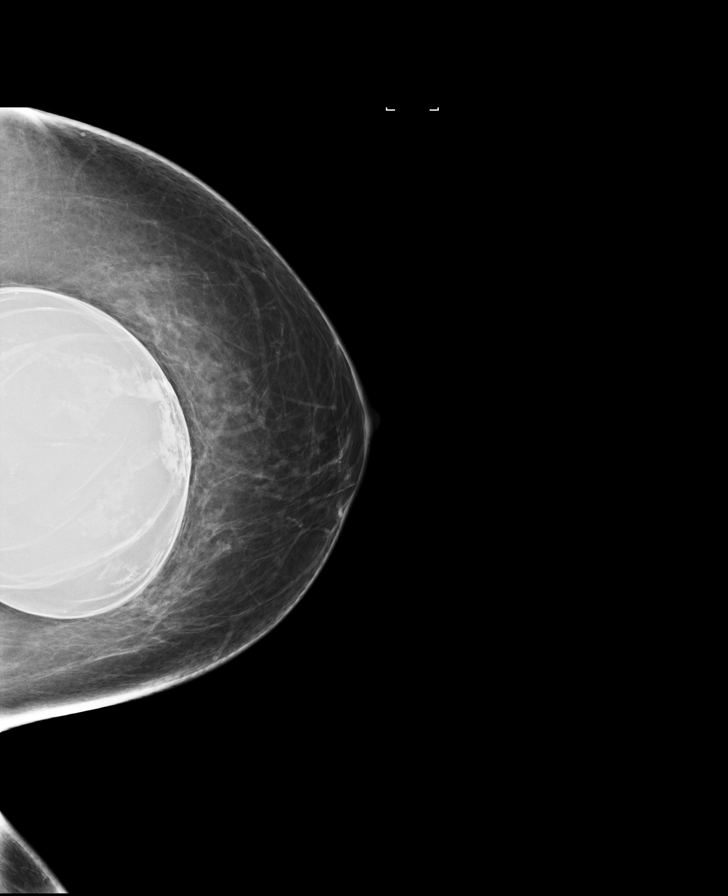

[R CC]
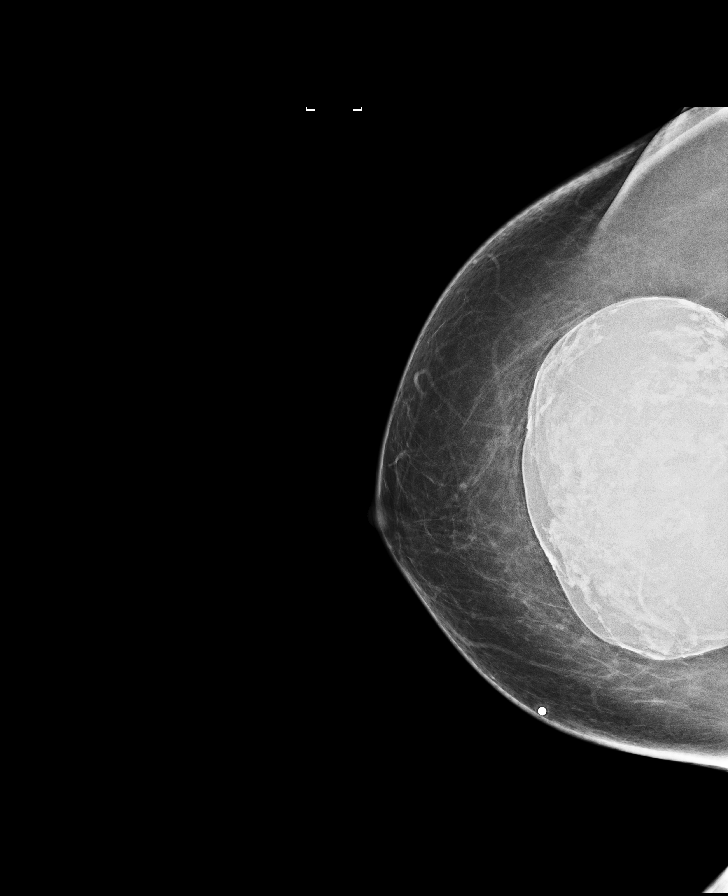

[L CC synth-2D]
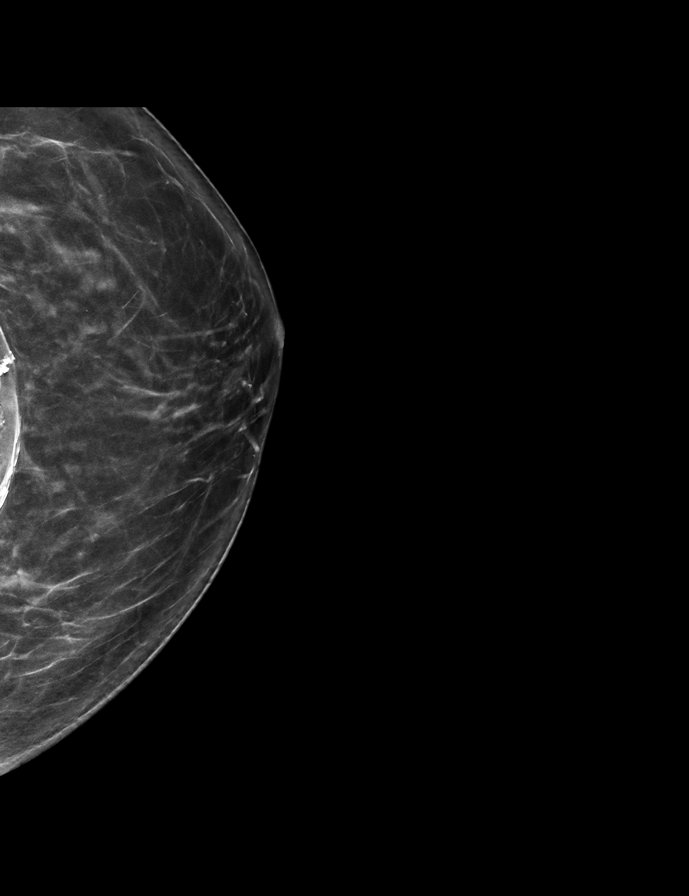

[R TAN synth-2D]
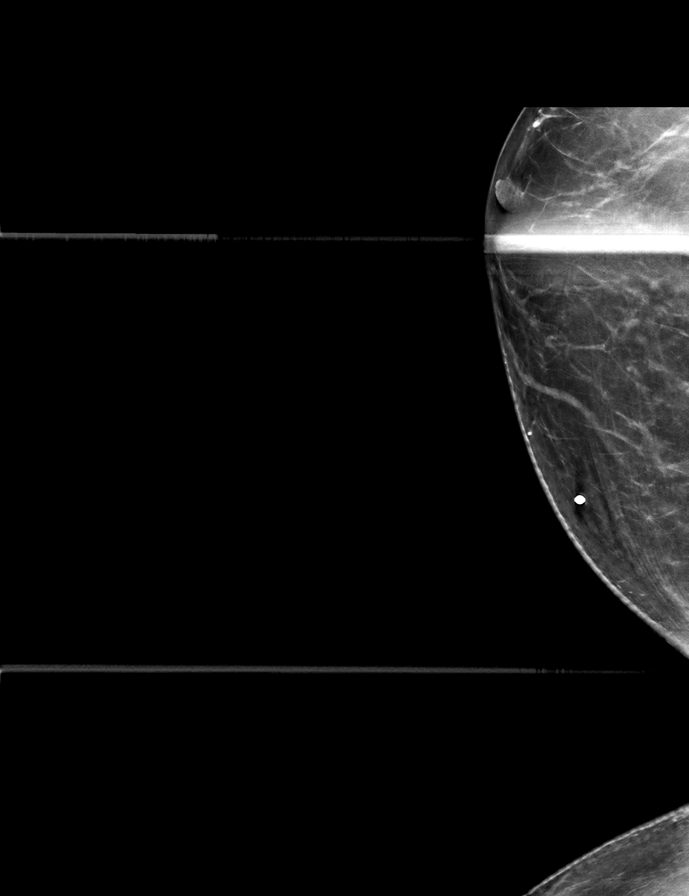

[R MLO synth-2D]
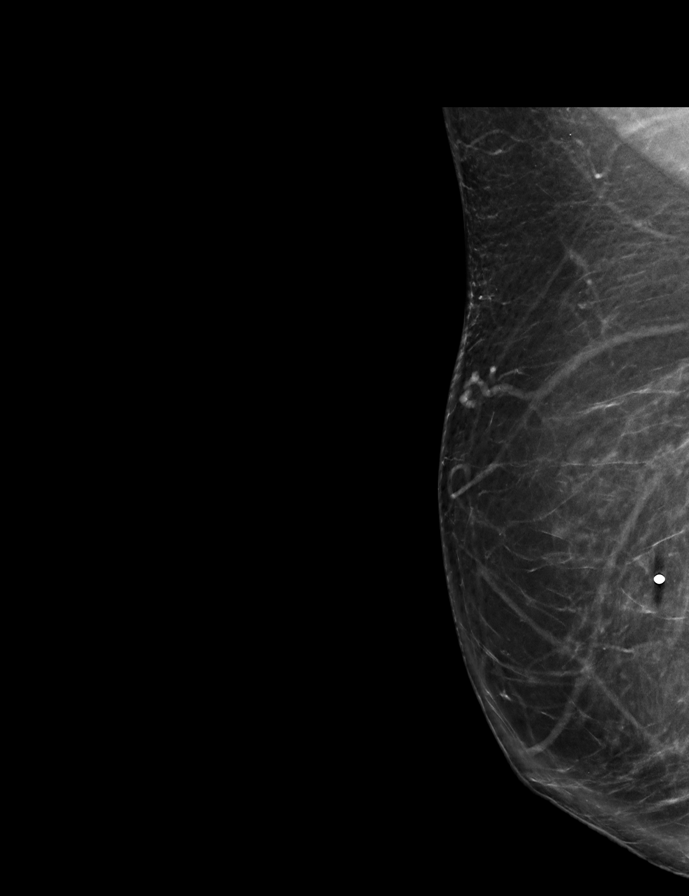

[L MLO synth-2D]
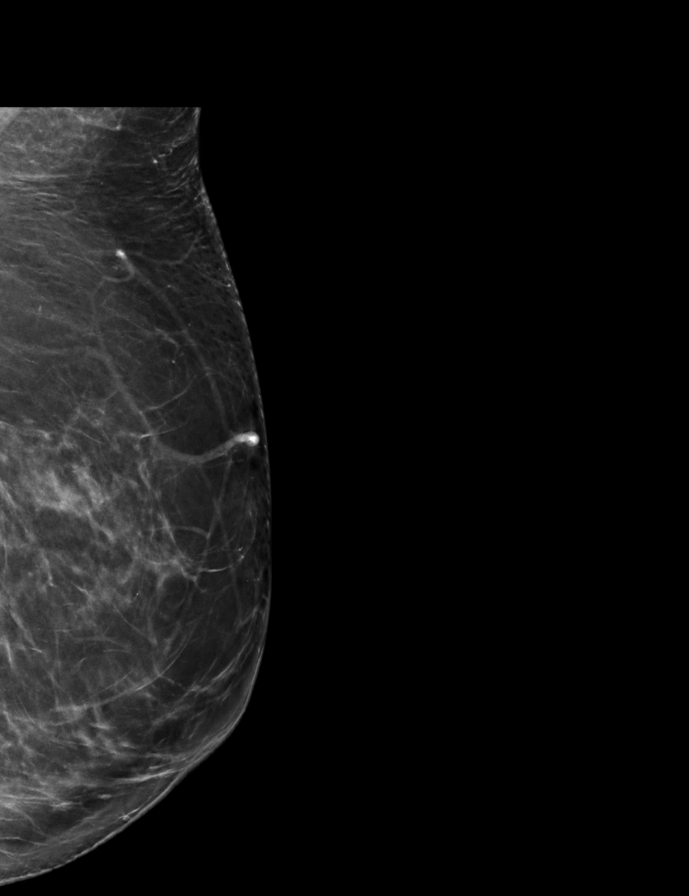

[8 of 34 positions shown; findings below may reference images not displayed]

FINDINGS: 2D/3D full field views of both breasts and a spot compression view
of the RIGHT breast demonstrate no suspicious mass, distortion or
worrisome calcifications. The patient has retroglandular implants.

On physical exam, firm thickening within the UPPER INNER RIGHT
breast noted.

Targeted ultrasound is performed, showing the RIGHT implant
corresponding to the palpable area of thickening. No solid or cystic
mass, distortion or abnormal shadowing noted.
IMPRESSION: 1. RIGHT breast implant corresponding to palpable thickening within
the UPPER INNER RIGHT breast.
2. No evidence of breast malignancy.

RECOMMENDATION:
Bilateral screening mammogram in 1 year.

I have discussed the findings and recommendations with the patient.
If applicable, a reminder letter will be sent to the patient
regarding the next appointment.

BI-RADS CATEGORY  1: Negative.

## 2022-06-11 IMAGING — US US BREAST*R* LIMITED INC AXILLA
1 series · 6 of 6 positions shown · non-contrast
Comparison: Previous exam(s).

ACR Breast Density Category a: The breast tissue is almost entirely
fatty.

CLINICAL DATA: 60-year-old female with palpable thickening in the
UPPER INNER RIGHT breast discovered on clinical examination. Also
for annual bilateral mammogram.

EXAM:
DIGITAL DIAGNOSTIC BILATERAL MAMMOGRAM WITH IMPLANTS, CAD AND
TOMOSYNTHESIS; ULTRASOUND RIGHT BREAST LIMITED
TECHNIQUE: Bilateral digital diagnostic mammography and breast tomosynthesis
was performed. The images were evaluated with computer-aided
detection. Standard and/or implant displaced views were performed.;
Targeted ultrasound examination of the right breast was performed

[Series 1: us breast*right* limited inc axilla · 0.07mm/px · 6 of 6 slices shown]
[im 1/6]
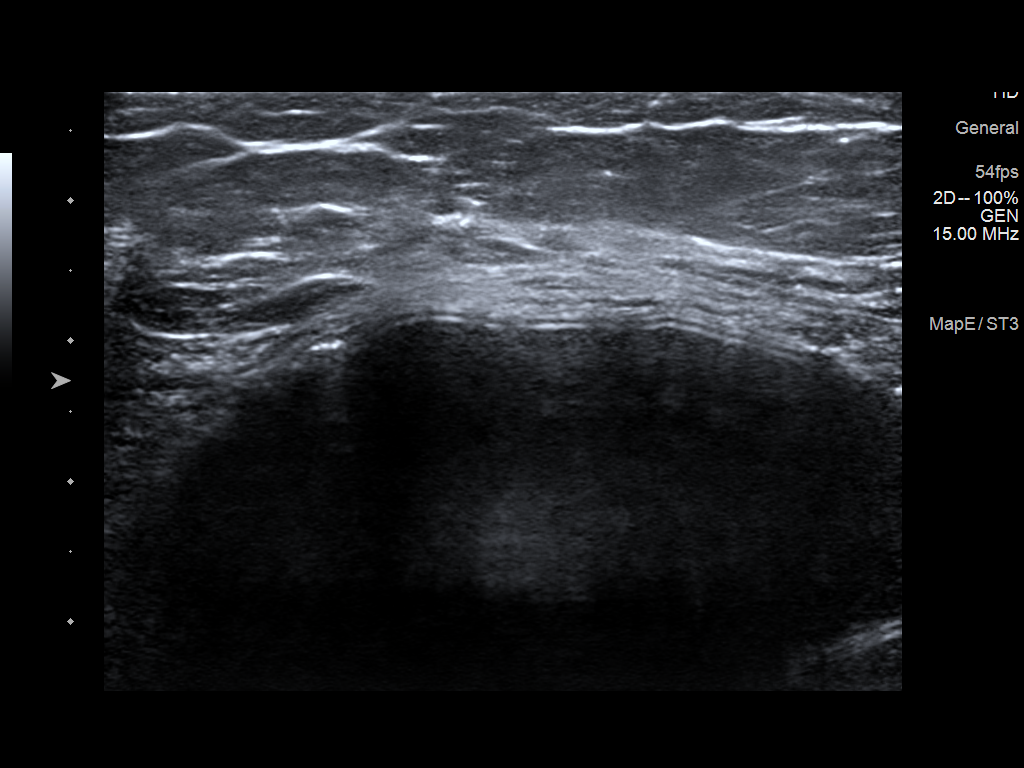
[im 2/6]
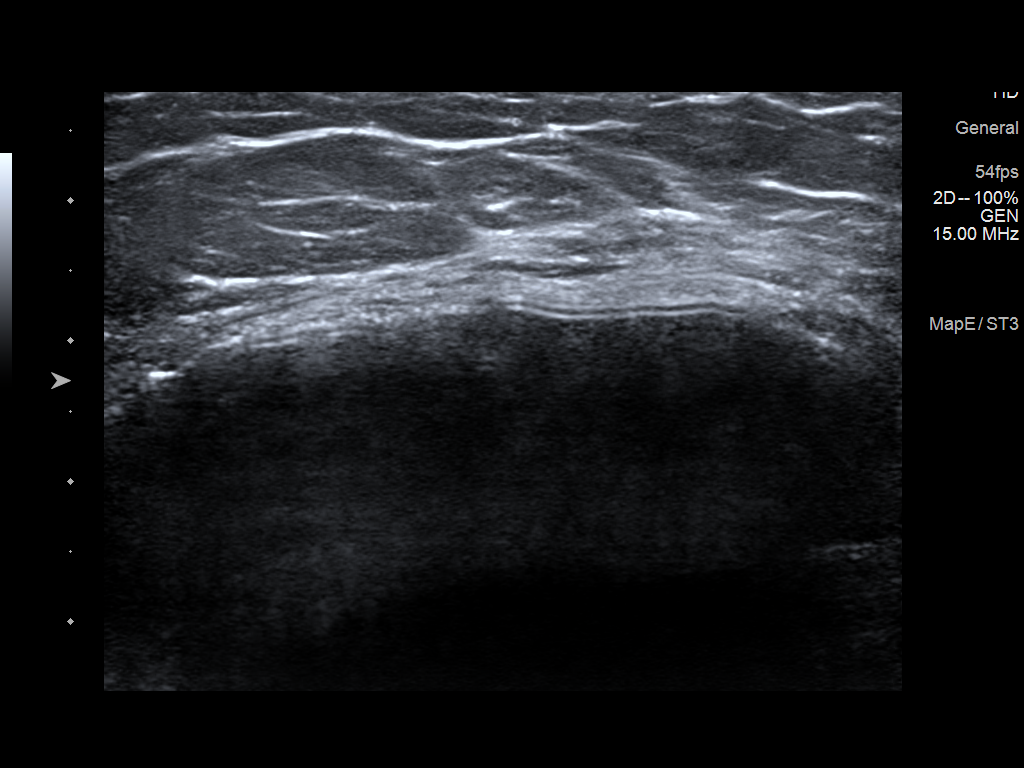
[im 3/6]
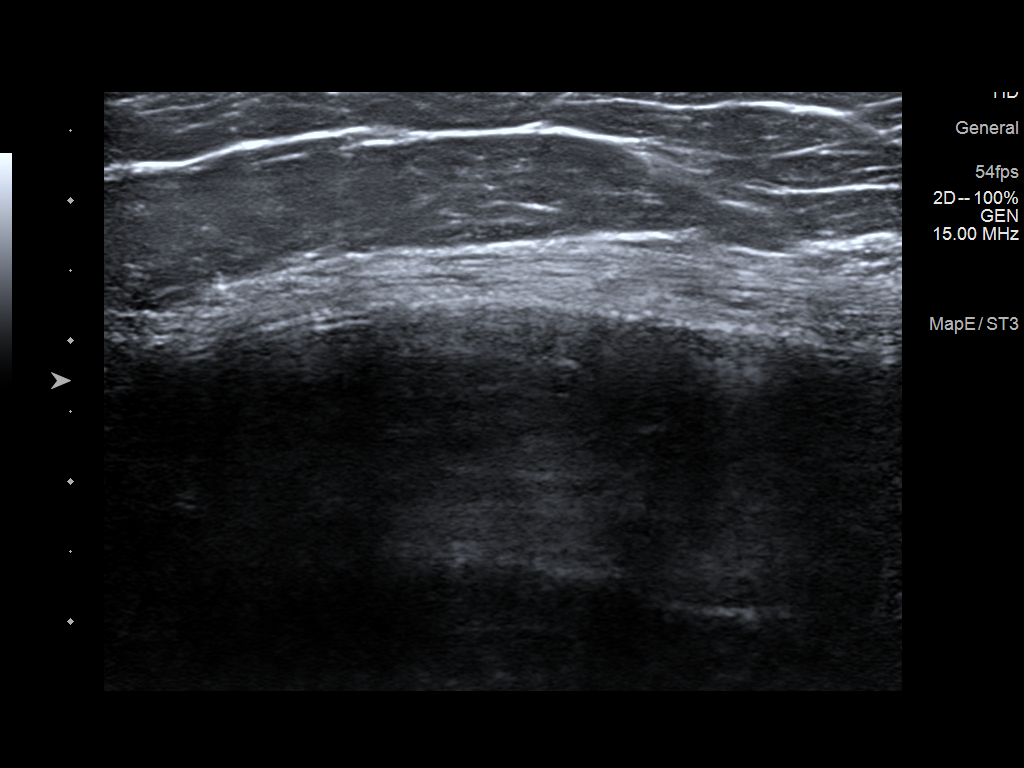
[im 4/6]
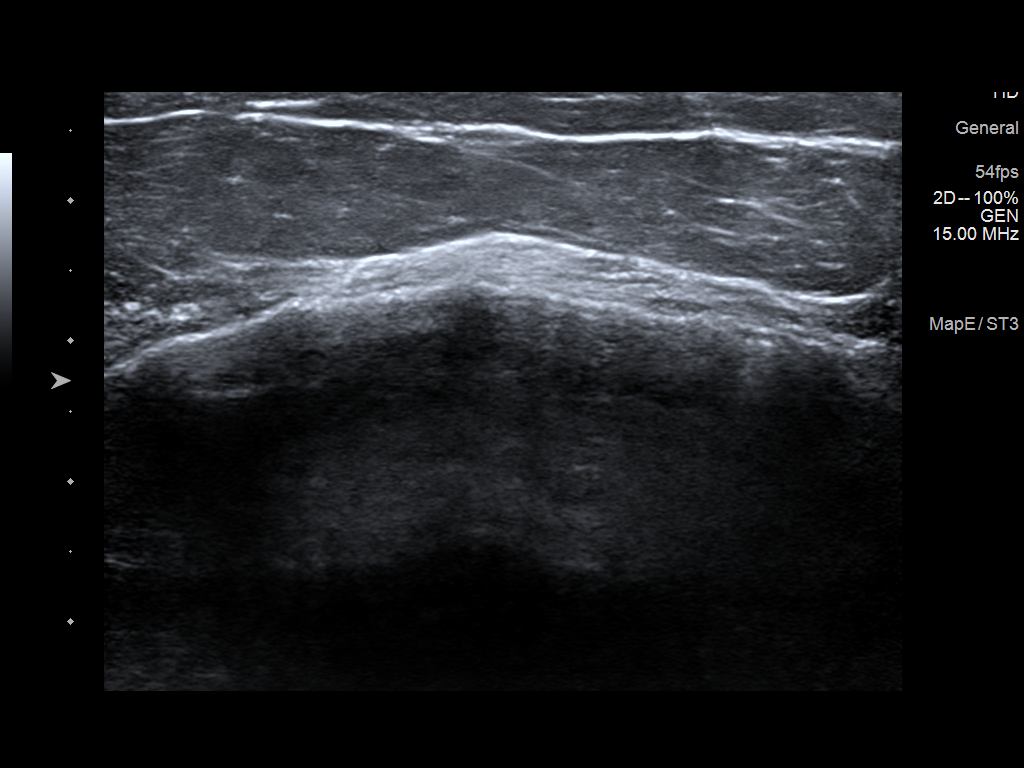
[im 5/6]
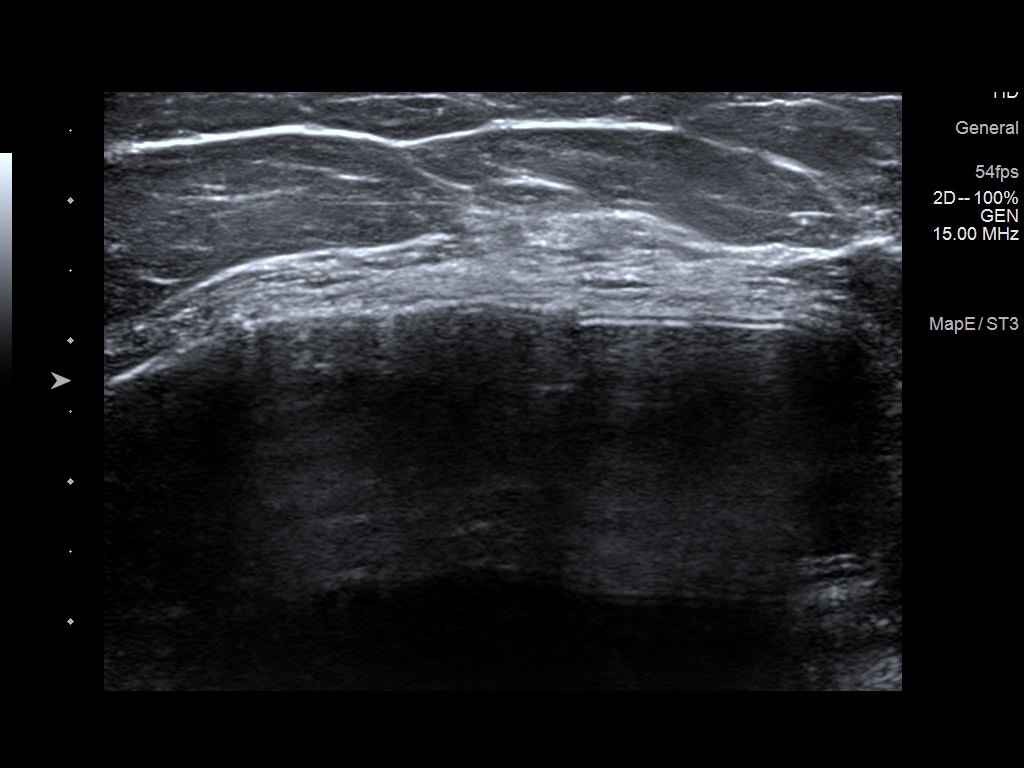
[im 6/6]
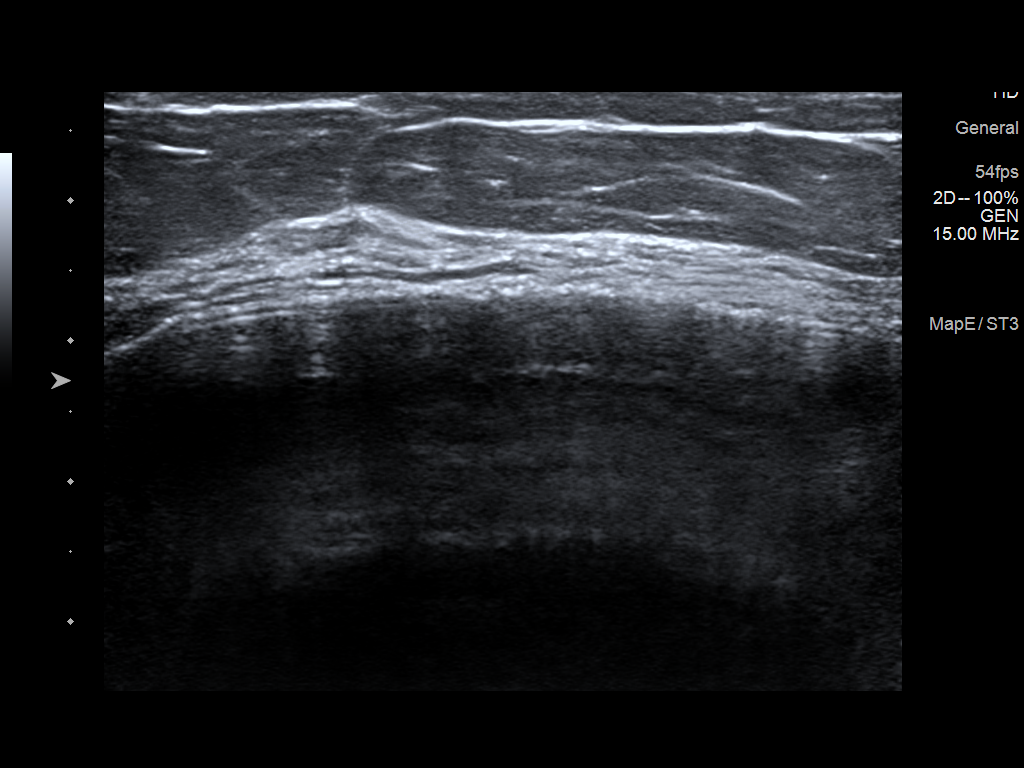

[6 of 6 positions shown; findings below may reference images not displayed]

FINDINGS: 2D/3D full field views of both breasts and a spot compression view
of the RIGHT breast demonstrate no suspicious mass, distortion or
worrisome calcifications. The patient has retroglandular implants.

On physical exam, firm thickening within the UPPER INNER RIGHT
breast noted.

Targeted ultrasound is performed, showing the RIGHT implant
corresponding to the palpable area of thickening. No solid or cystic
mass, distortion or abnormal shadowing noted.
IMPRESSION: 1. RIGHT breast implant corresponding to palpable thickening within
the UPPER INNER RIGHT breast.
2. No evidence of breast malignancy.

RECOMMENDATION:
Bilateral screening mammogram in 1 year.

I have discussed the findings and recommendations with the patient.
If applicable, a reminder letter will be sent to the patient
regarding the next appointment.

BI-RADS CATEGORY  1: Negative.
